# Patient Record
Sex: Female | Born: 1950 | Hispanic: Yes | Marital: Married | Smoking: Former smoker
Health system: Southern US, Community
[De-identification: ages and names within clinical notes are randomized; demographics above are authoritative.]

## PROBLEM LIST (undated history)

## (undated) DIAGNOSIS — Z9221 Personal history of antineoplastic chemotherapy: Secondary | ICD-10-CM

## (undated) DIAGNOSIS — I10 Essential (primary) hypertension: Secondary | ICD-10-CM

## (undated) DIAGNOSIS — K219 Gastro-esophageal reflux disease without esophagitis: Secondary | ICD-10-CM

## (undated) DIAGNOSIS — Z923 Personal history of irradiation: Secondary | ICD-10-CM

## (undated) DIAGNOSIS — E785 Hyperlipidemia, unspecified: Secondary | ICD-10-CM

## (undated) DIAGNOSIS — C50919 Malignant neoplasm of unspecified site of unspecified female breast: Secondary | ICD-10-CM

---

## 1996-12-29 HISTORY — PX: BREAST LUMPECTOMY: SHX2

## 1996-12-29 HISTORY — PX: BREAST BIOPSY: SHX20

## 1997-04-28 DIAGNOSIS — C50919 Malignant neoplasm of unspecified site of unspecified female breast: Secondary | ICD-10-CM

## 1997-04-28 HISTORY — DX: Malignant neoplasm of unspecified site of unspecified female breast: C50.919

## 2011-08-29 ENCOUNTER — Encounter: Payer: Self-pay | Admitting: Internal Medicine

## 2011-08-30 ENCOUNTER — Encounter: Payer: Self-pay | Admitting: Internal Medicine

## 2011-09-24 ENCOUNTER — Ambulatory Visit: Payer: Self-pay | Admitting: Internal Medicine

## 2013-04-29 ENCOUNTER — Encounter: Payer: Self-pay | Admitting: Internal Medicine

## 2017-01-06 ENCOUNTER — Other Ambulatory Visit: Payer: Self-pay | Admitting: Family Medicine

## 2017-01-06 DIAGNOSIS — M8588 Other specified disorders of bone density and structure, other site: Secondary | ICD-10-CM

## 2017-05-21 ENCOUNTER — Other Ambulatory Visit: Payer: Self-pay | Admitting: Family Medicine

## 2017-06-10 ENCOUNTER — Ambulatory Visit
Admission: RE | Admit: 2017-06-10 | Discharge: 2017-06-10 | Disposition: A | Discharge status: Home / Self Care | Payer: Medicare Other | Source: Ambulatory Visit | Attending: Family Medicine | Admitting: Family Medicine

## 2017-06-10 ENCOUNTER — Encounter: Payer: Self-pay | Admitting: Radiology

## 2017-06-10 DIAGNOSIS — Z13820 Encounter for screening for osteoporosis: Secondary | ICD-10-CM | POA: Insufficient documentation

## 2017-06-10 DIAGNOSIS — M8588 Other specified disorders of bone density and structure, other site: Secondary | ICD-10-CM | POA: Insufficient documentation

## 2017-06-10 HISTORY — DX: Malignant neoplasm of unspecified site of unspecified female breast: C50.919

## 2017-06-11 ENCOUNTER — Inpatient Hospital Stay
Admission: RE | Admit: 2017-06-11 | Discharge: 2017-06-11 | Disposition: A | Discharge status: Home / Self Care | Payer: Self-pay | Source: Ambulatory Visit | Attending: *Deleted | Admitting: *Deleted

## 2017-06-11 ENCOUNTER — Other Ambulatory Visit: Payer: Self-pay | Admitting: *Deleted

## 2017-06-11 DIAGNOSIS — Z9289 Personal history of other medical treatment: Secondary | ICD-10-CM

## 2017-06-16 ENCOUNTER — Other Ambulatory Visit: Payer: Self-pay | Admitting: Family Medicine

## 2017-06-16 DIAGNOSIS — Z1231 Encounter for screening mammogram for malignant neoplasm of breast: Secondary | ICD-10-CM

## 2017-06-29 ENCOUNTER — Ambulatory Visit
Admission: RE | Admit: 2017-06-29 | Discharge: 2017-06-29 | Disposition: A | Discharge status: Home / Self Care | Payer: Medicare Other | Source: Ambulatory Visit | Attending: Family Medicine | Admitting: Family Medicine

## 2017-06-29 DIAGNOSIS — Z1231 Encounter for screening mammogram for malignant neoplasm of breast: Secondary | ICD-10-CM | POA: Insufficient documentation

## 2017-06-29 IMAGING — MG MM DIGITAL SCREENING BILAT W/ CAD
4 series · 4 of 4 positions shown · non-contrast
Comparison: Previous exam(s).

CLINICAL DATA: Screening.

EXAM:
DIGITAL SCREENING BILATERAL MAMMOGRAM WITH CAD

[L CC]
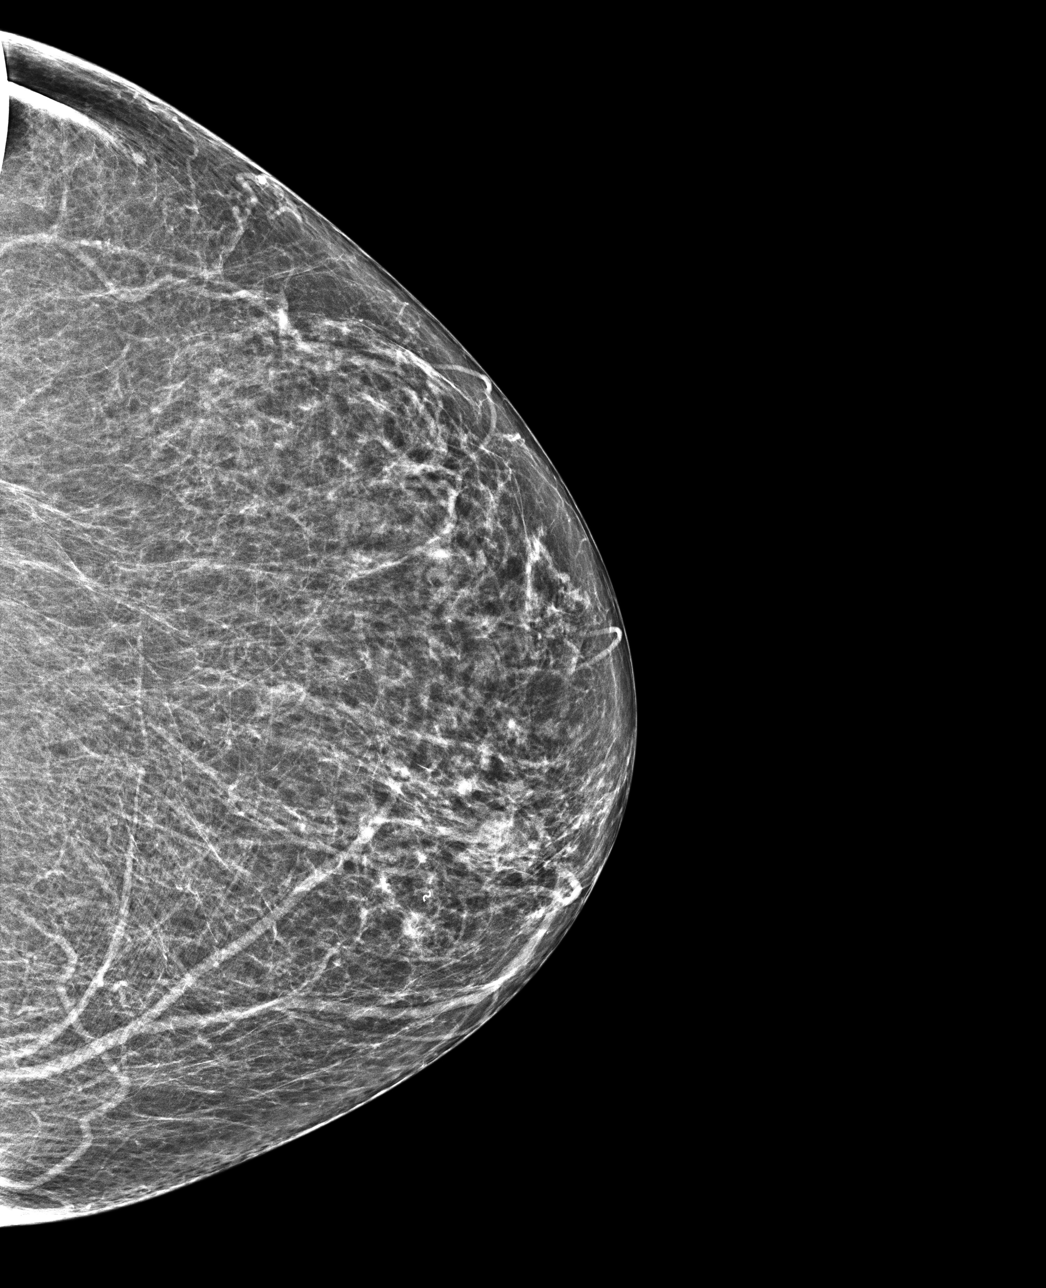

[R CC]
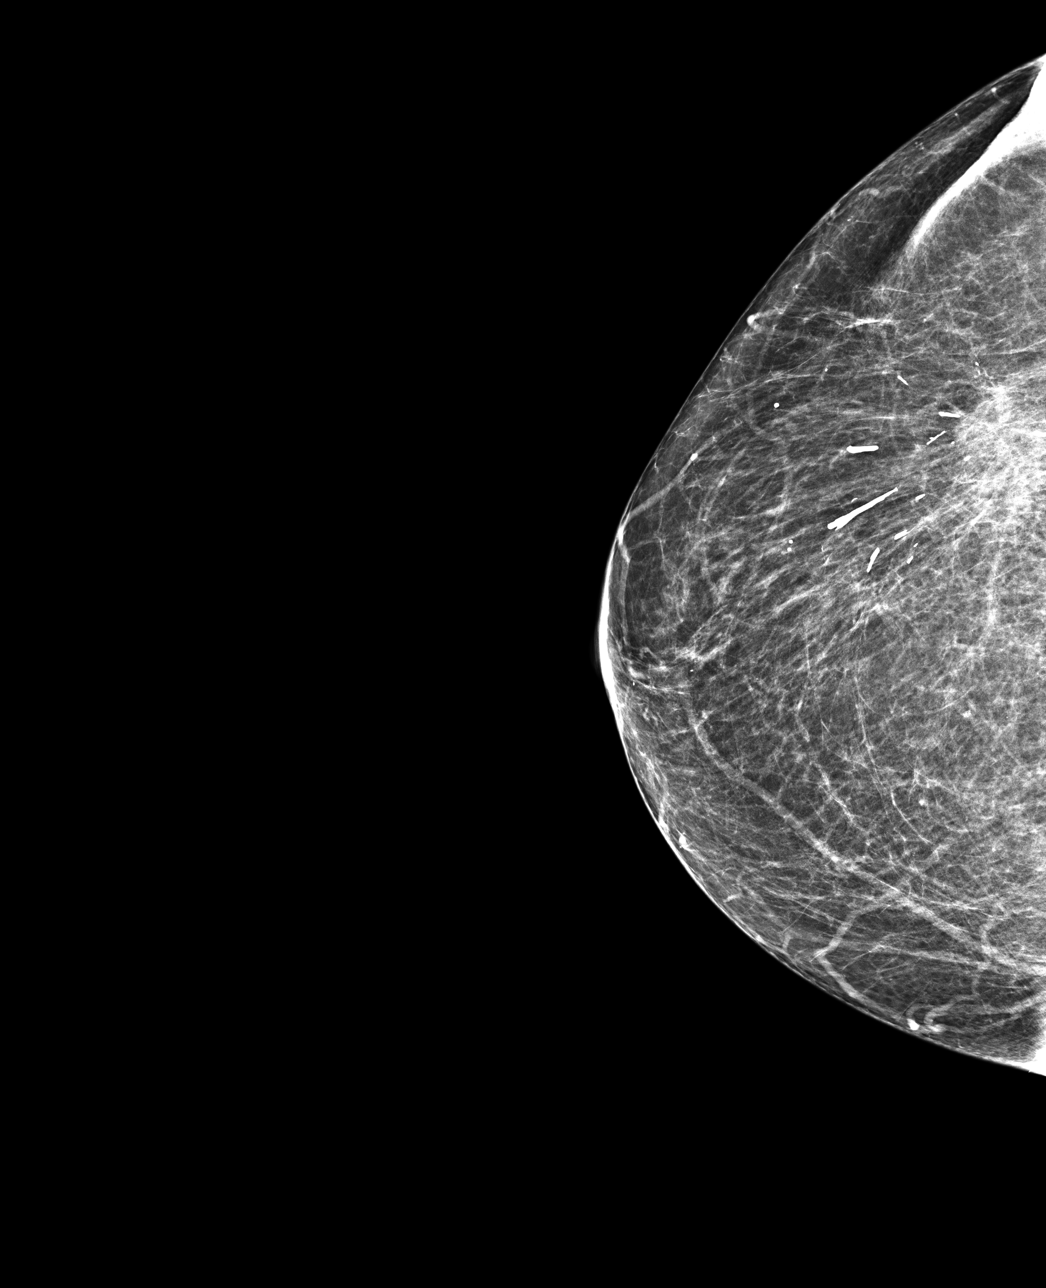

[L MLO]
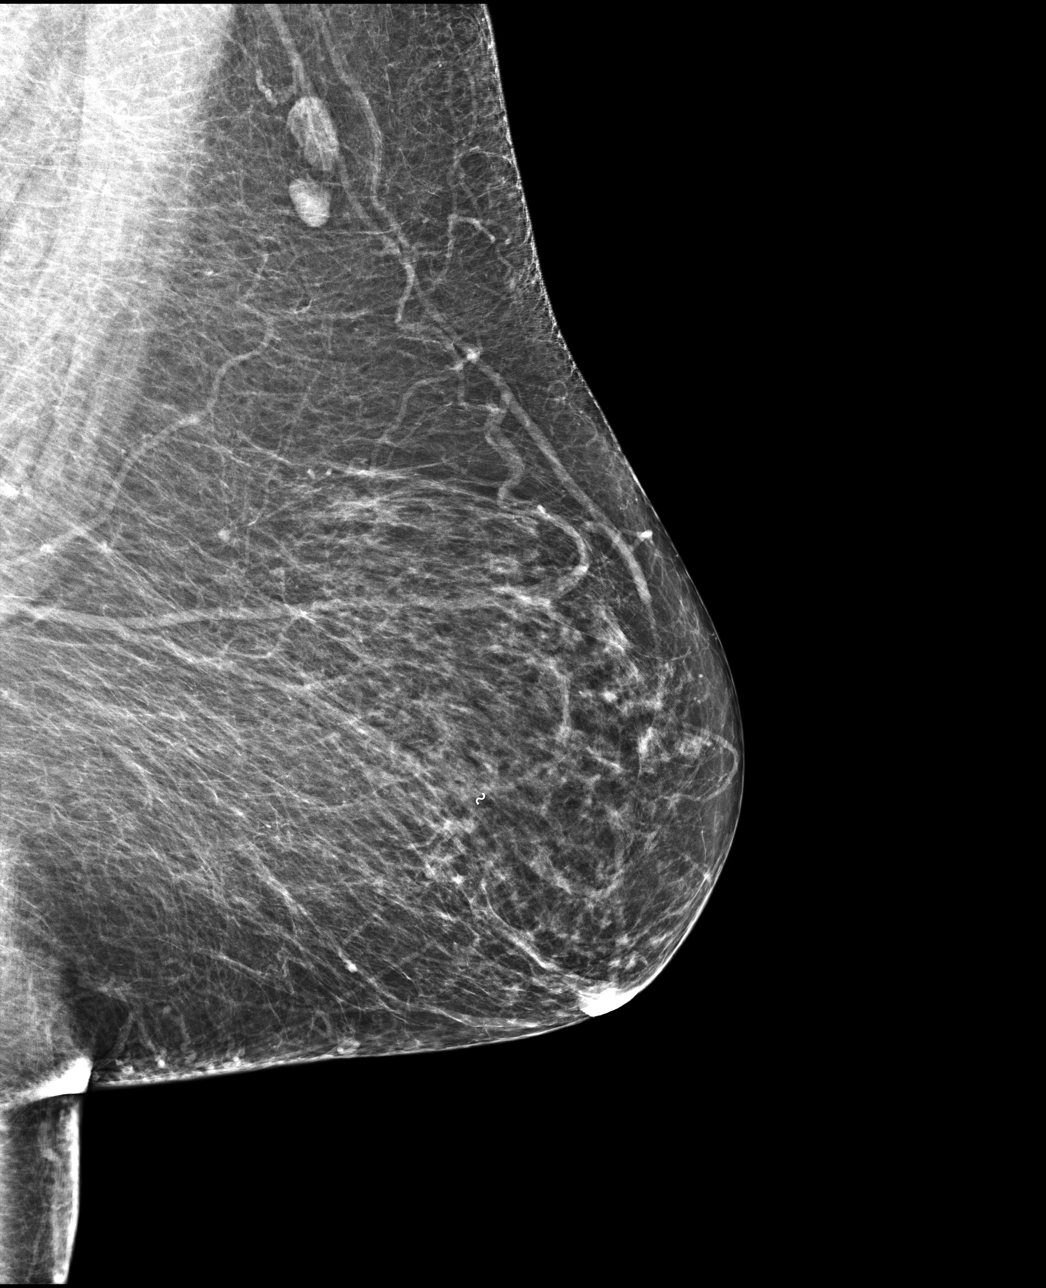

[R MLO]
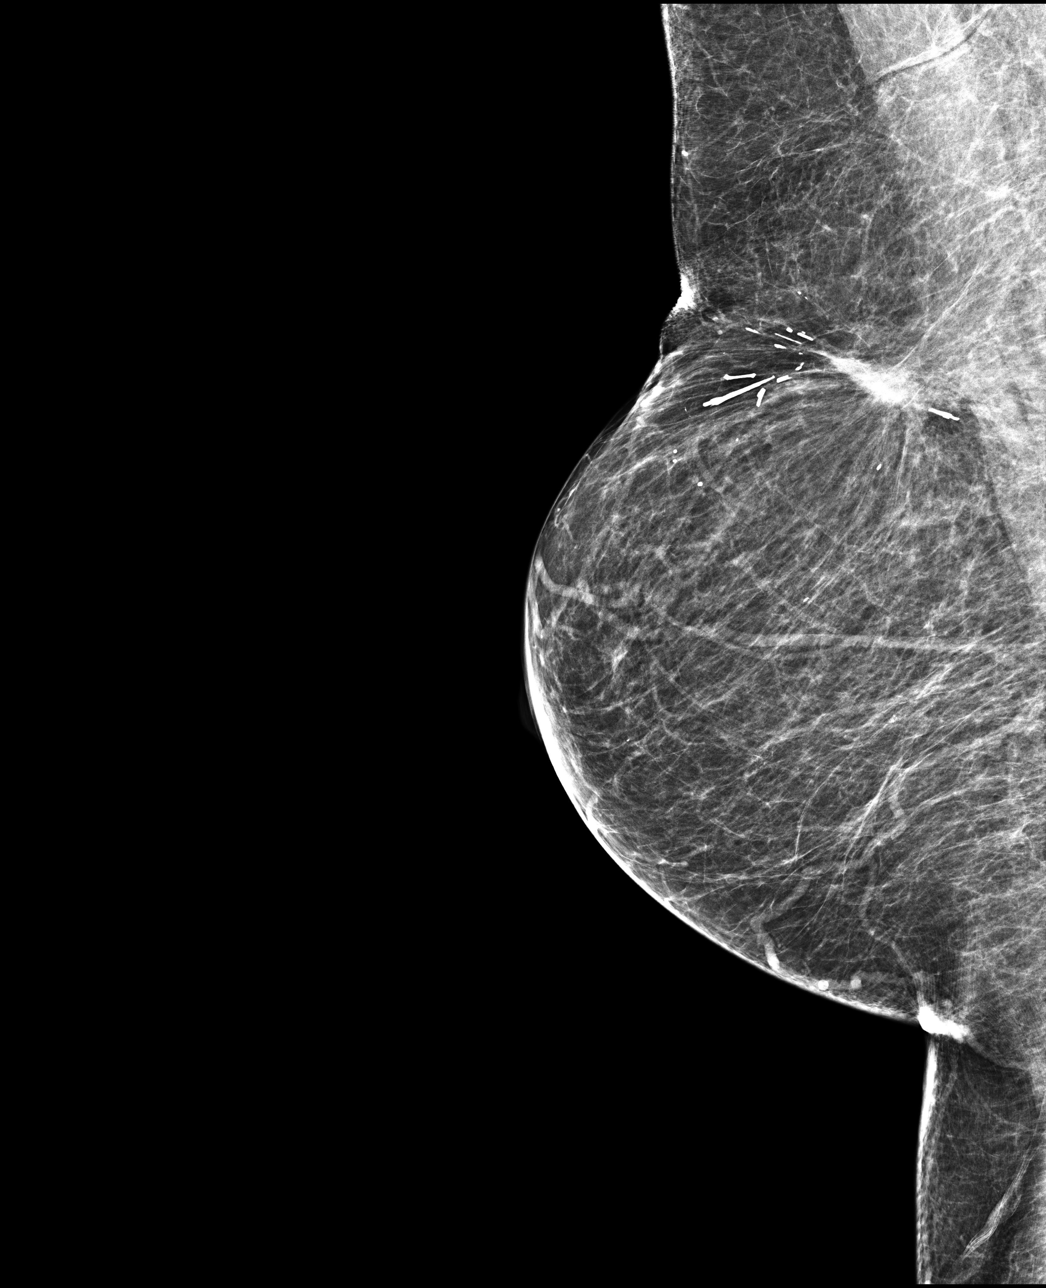

[4 of 4 positions shown; findings below may reference images not displayed]

ACR Breast Density Category b: There are scattered areas of
fibroglandular density.
FINDINGS: There are no findings suspicious for malignancy. Images were
processed with CAD.
IMPRESSION: No mammographic evidence of malignancy. A result letter of this
screening mammogram will be mailed directly to the patient.

RECOMMENDATION:
Screening mammogram in one year. (Code:[US])

BI-RADS CATEGORY  1: Negative.

## 2019-10-05 ENCOUNTER — Other Ambulatory Visit: Payer: Self-pay | Admitting: Family Medicine

## 2019-10-05 DIAGNOSIS — M8580 Other specified disorders of bone density and structure, unspecified site: Secondary | ICD-10-CM

## 2019-10-05 DIAGNOSIS — Z780 Asymptomatic menopausal state: Secondary | ICD-10-CM

## 2020-03-23 ENCOUNTER — Other Ambulatory Visit: Payer: Self-pay | Admitting: Physician Assistant

## 2020-03-23 DIAGNOSIS — Z1231 Encounter for screening mammogram for malignant neoplasm of breast: Secondary | ICD-10-CM

## 2020-03-26 ENCOUNTER — Other Ambulatory Visit: Payer: Self-pay | Admitting: Physician Assistant

## 2020-03-26 DIAGNOSIS — Z780 Asymptomatic menopausal state: Secondary | ICD-10-CM

## 2020-03-27 ENCOUNTER — Inpatient Hospital Stay: Admission: RE | Admit: 2020-03-27 | Payer: Medicare Other | Source: Ambulatory Visit

## 2020-03-27 ENCOUNTER — Other Ambulatory Visit: Payer: Self-pay

## 2020-03-27 ENCOUNTER — Encounter (INDEPENDENT_AMBULATORY_CARE_PROVIDER_SITE_OTHER): Payer: Self-pay

## 2020-03-27 ENCOUNTER — Ambulatory Visit
Admission: RE | Admit: 2020-03-27 | Discharge: 2020-03-27 | Disposition: A | Discharge status: Home / Self Care | Payer: Medicare Other | Source: Ambulatory Visit | Attending: Physician Assistant | Admitting: Physician Assistant

## 2020-03-27 DIAGNOSIS — Z1231 Encounter for screening mammogram for malignant neoplasm of breast: Secondary | ICD-10-CM | POA: Diagnosis present

## 2020-03-27 HISTORY — DX: Personal history of antineoplastic chemotherapy: Z92.21

## 2020-03-27 HISTORY — DX: Personal history of irradiation: Z92.3

## 2020-03-27 IMAGING — MG DIGITAL SCREENING BILAT W/ TOMO W/ CAD
6 of 12 series · 6 of 36 positions shown · non-contrast
Comparison: Previous exam(s).

CLINICAL DATA: Screening. History of RIGHT breast cancer in [O5]
status post lumpectomy and radiation therapy.

EXAM:
DIGITAL SCREENING BILATERAL MAMMOGRAM WITH TOMO AND CAD

[L MLO synth-2D]
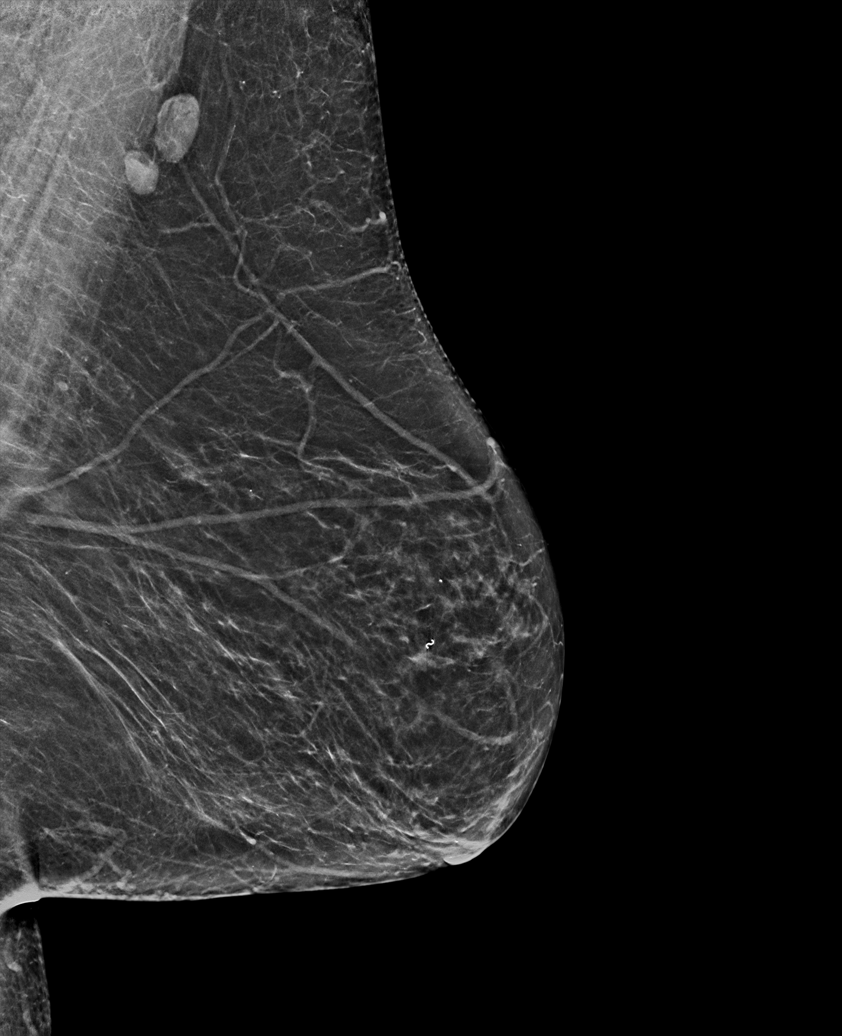

[L CC synth-2D (1 of 2)]
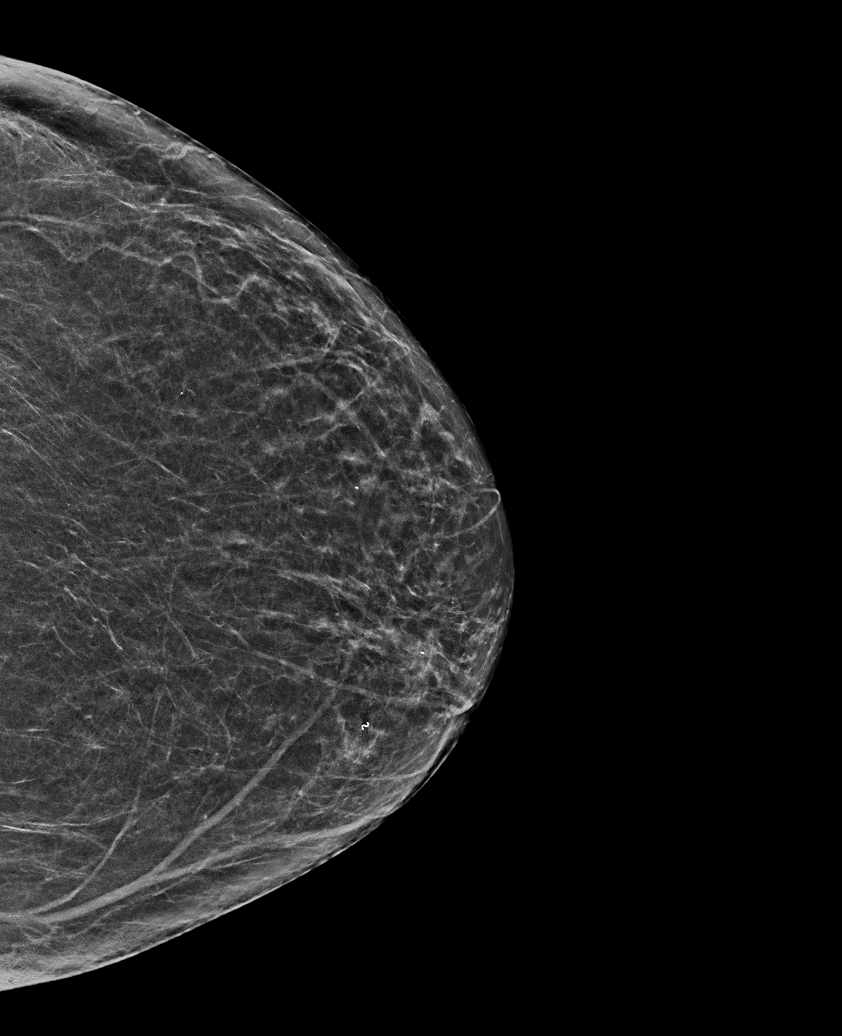

[R MLO synth-2D]
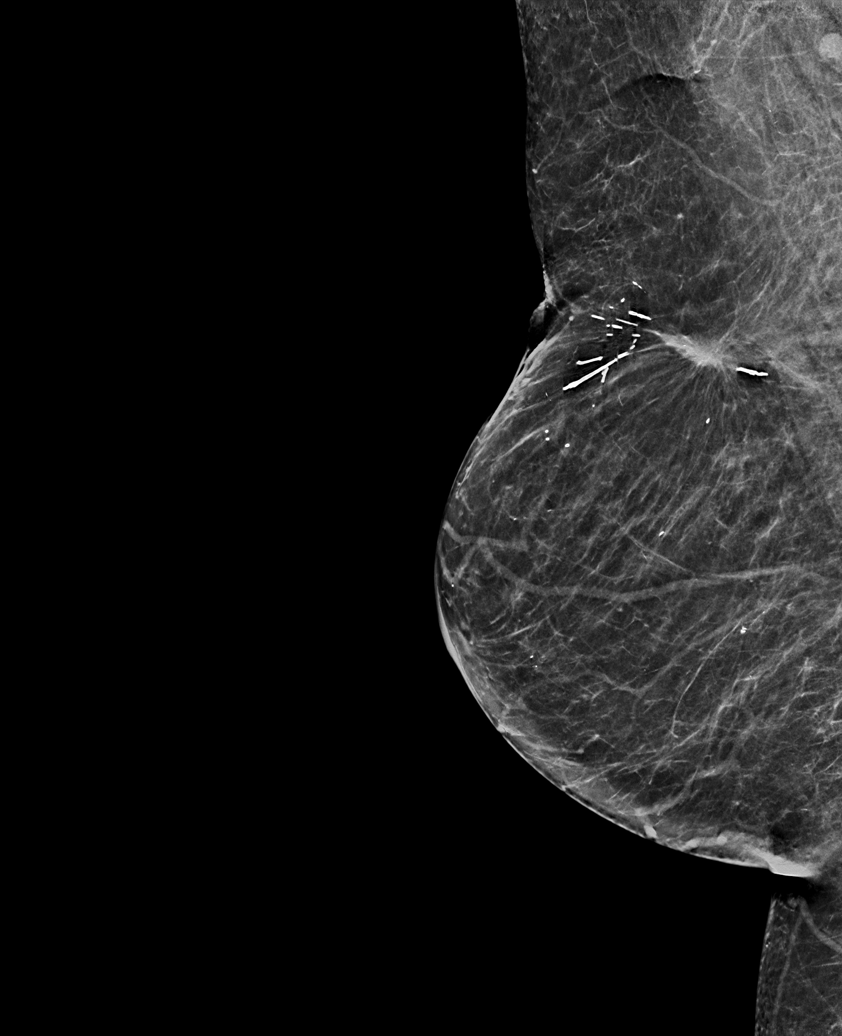

[R CC synth-2D]
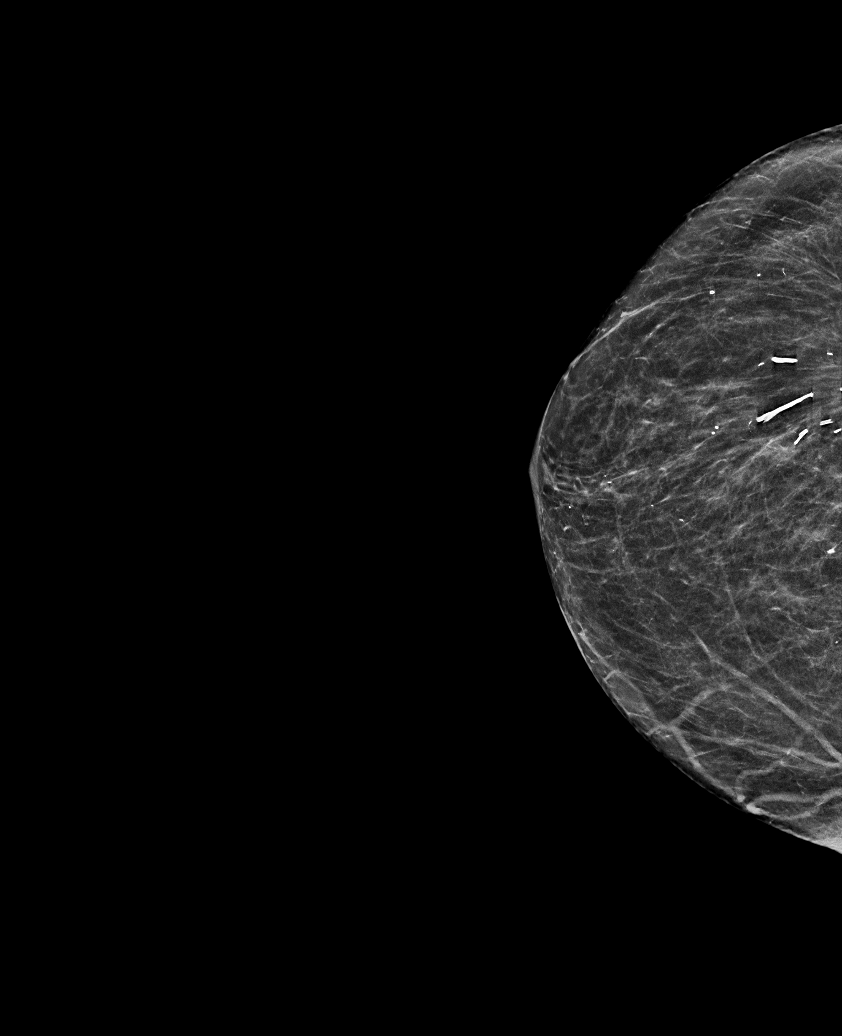

[L CC synth-2D (2 of 2)]
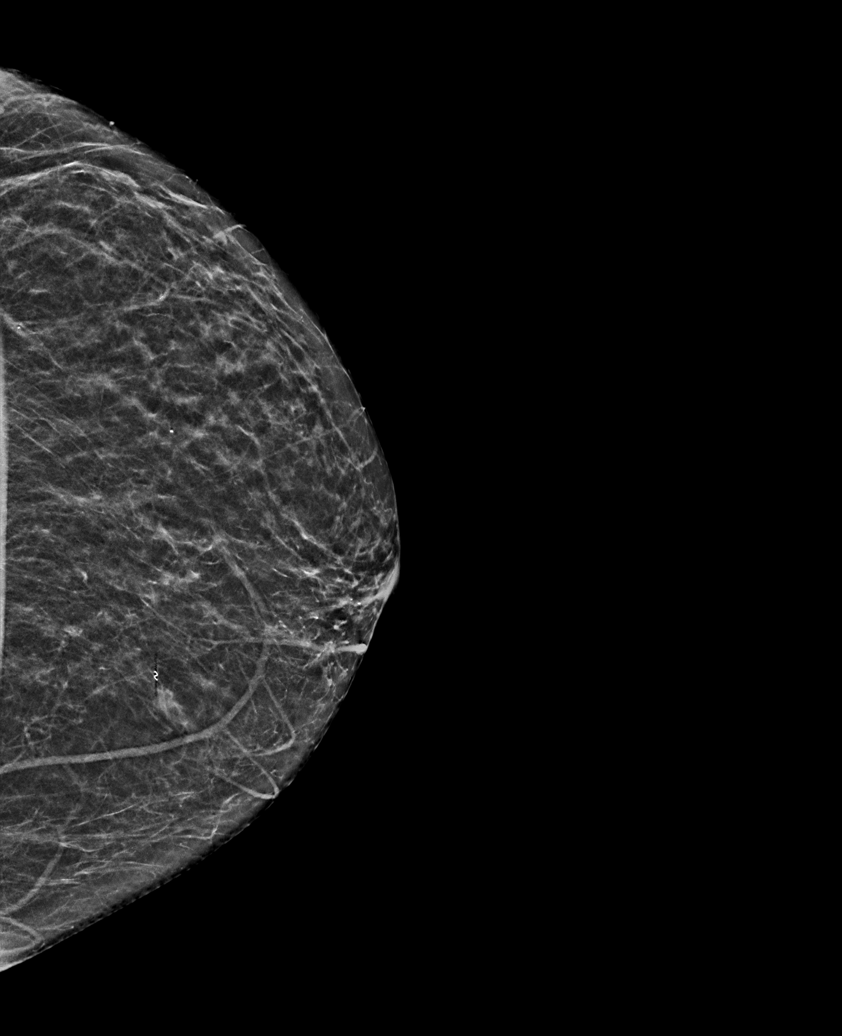

[R XCCL synth-2D]
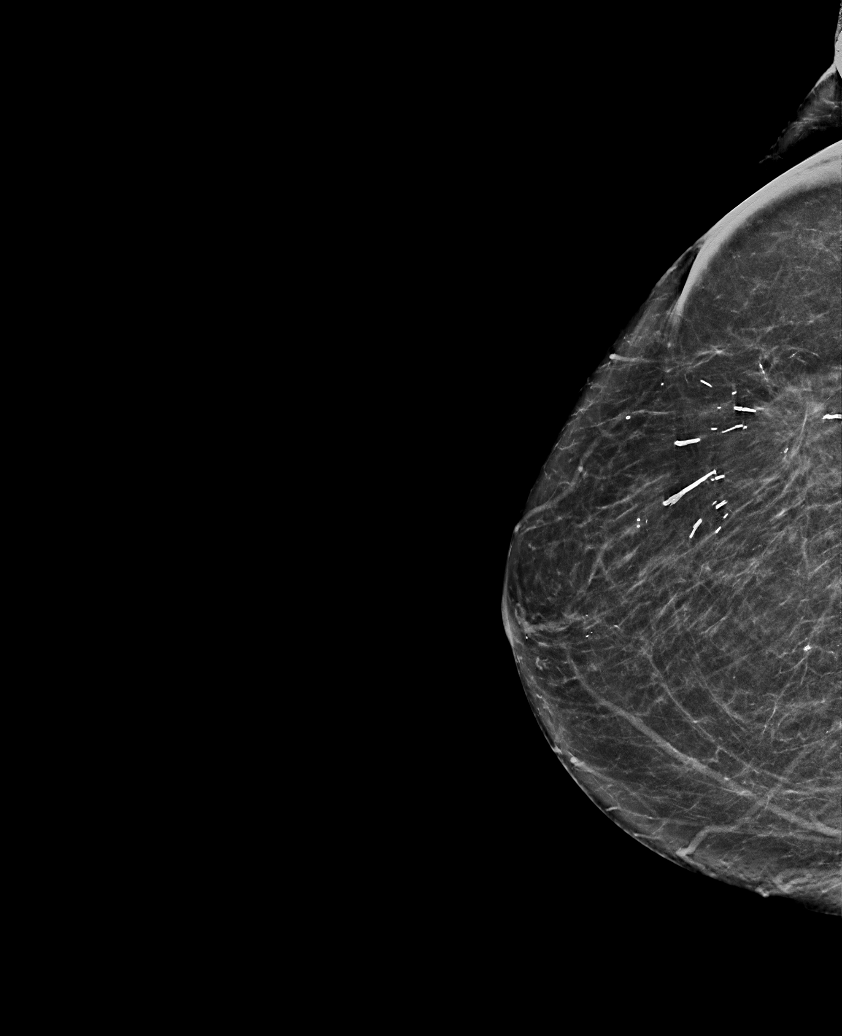

[6 of 36 positions shown; findings below may reference images not displayed]

ACR Breast Density Category b: There are scattered areas of
fibroglandular density.
FINDINGS: Stable postsurgical changes within the RIGHT breast. There are no
findings suspicious for malignancy within either breast. Images were
processed with CAD.
IMPRESSION: No mammographic evidence of malignancy. A result letter of this
screening mammogram will be mailed directly to the patient.

RECOMMENDATION:
Screening mammogram in one year. (Code:[O5])

BI-RADS CATEGORY  2: Benign.

## 2020-10-23 ENCOUNTER — Ambulatory Visit
Admission: RE | Admit: 2020-10-23 | Discharge: 2020-10-23 | Disposition: A | Discharge status: Home / Self Care | Payer: Medicare Other | Source: Ambulatory Visit | Attending: Physician Assistant | Admitting: Physician Assistant

## 2020-10-23 ENCOUNTER — Other Ambulatory Visit: Payer: Self-pay

## 2020-10-23 DIAGNOSIS — Z780 Asymptomatic menopausal state: Secondary | ICD-10-CM | POA: Insufficient documentation

## 2020-12-18 ENCOUNTER — Encounter: Payer: Self-pay | Admitting: Emergency Medicine

## 2020-12-18 ENCOUNTER — Ambulatory Visit (INDEPENDENT_AMBULATORY_CARE_PROVIDER_SITE_OTHER): Payer: Medicare Other

## 2020-12-18 ENCOUNTER — Ambulatory Visit
Admission: EM | Admit: 2020-12-18 | Discharge: 2020-12-18 | Disposition: A | Discharge status: Home / Self Care | Payer: Medicare Other | Attending: Sports Medicine | Admitting: Sports Medicine

## 2020-12-18 ENCOUNTER — Other Ambulatory Visit: Payer: Self-pay

## 2020-12-18 DIAGNOSIS — M5416 Radiculopathy, lumbar region: Secondary | ICD-10-CM

## 2020-12-18 DIAGNOSIS — M5136 Other intervertebral disc degeneration, lumbar region: Secondary | ICD-10-CM

## 2020-12-18 DIAGNOSIS — M79604 Pain in right leg: Secondary | ICD-10-CM

## 2020-12-18 HISTORY — DX: Essential (primary) hypertension: I10

## 2020-12-18 IMAGING — CR DG LUMBAR SPINE COMPLETE 4+V
5 series · 6 of 6 positions shown · non-contrast
Comparison: None.

CLINICAL DATA: Right leg pain.

EXAM:
LUMBAR SPINE - COMPLETE 4+ VIEW

[l-spine ap]
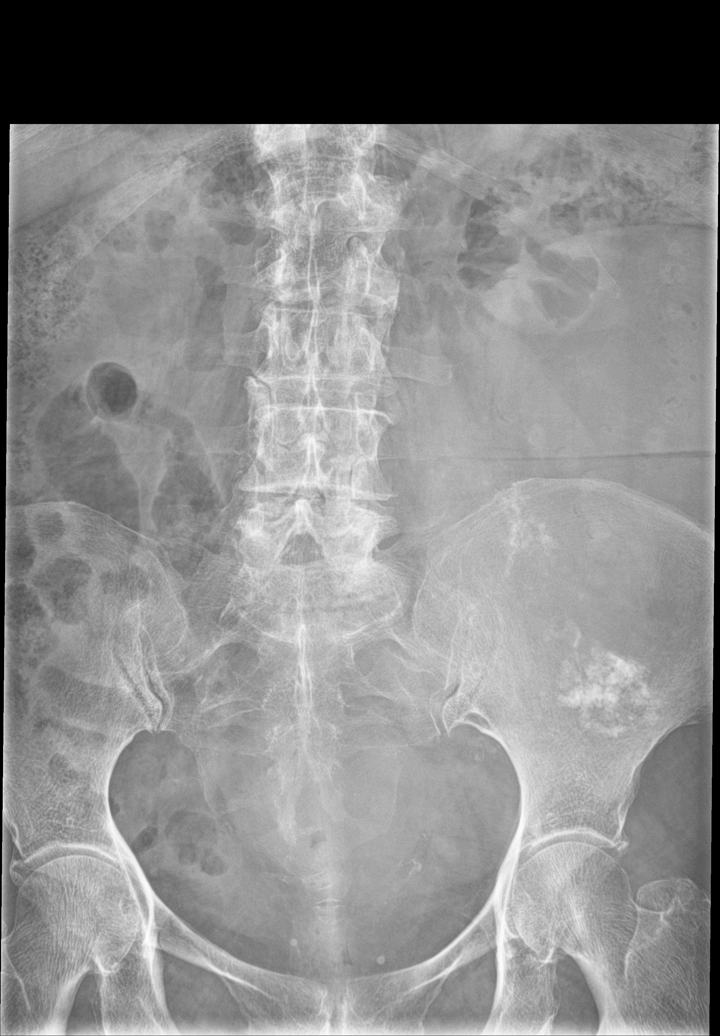

[Series 2: l-spine obl · 0.14mm/px · 2 of 2 slices shown (1 of 2)]
[im 1/2]
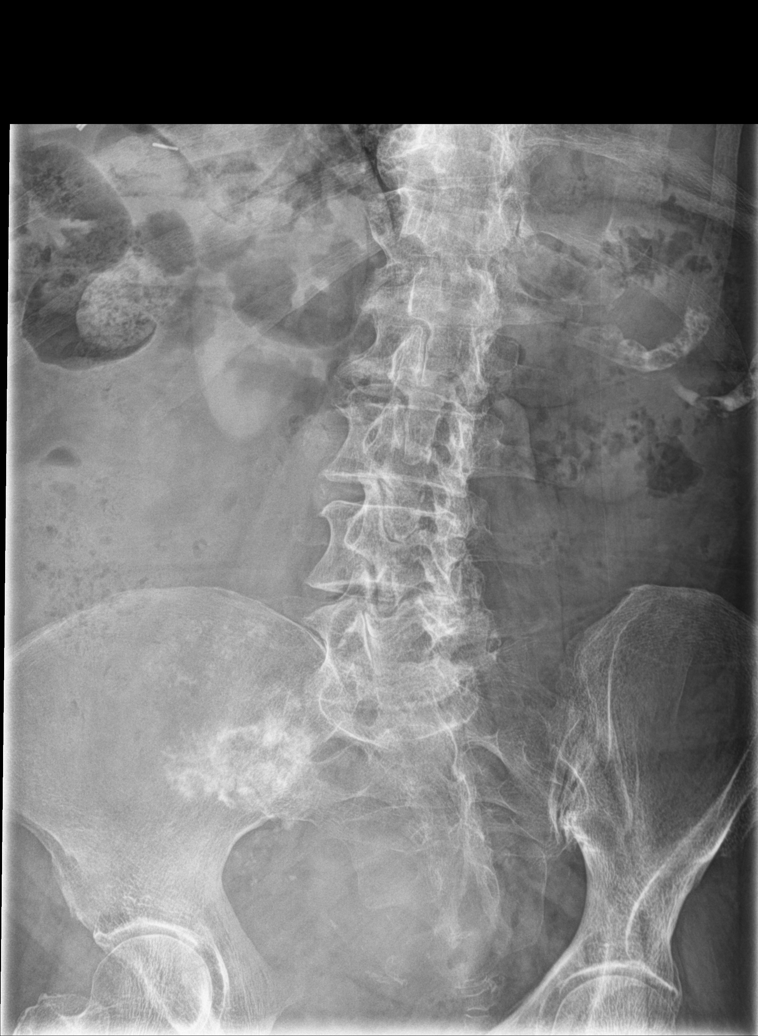
[im 2/2]
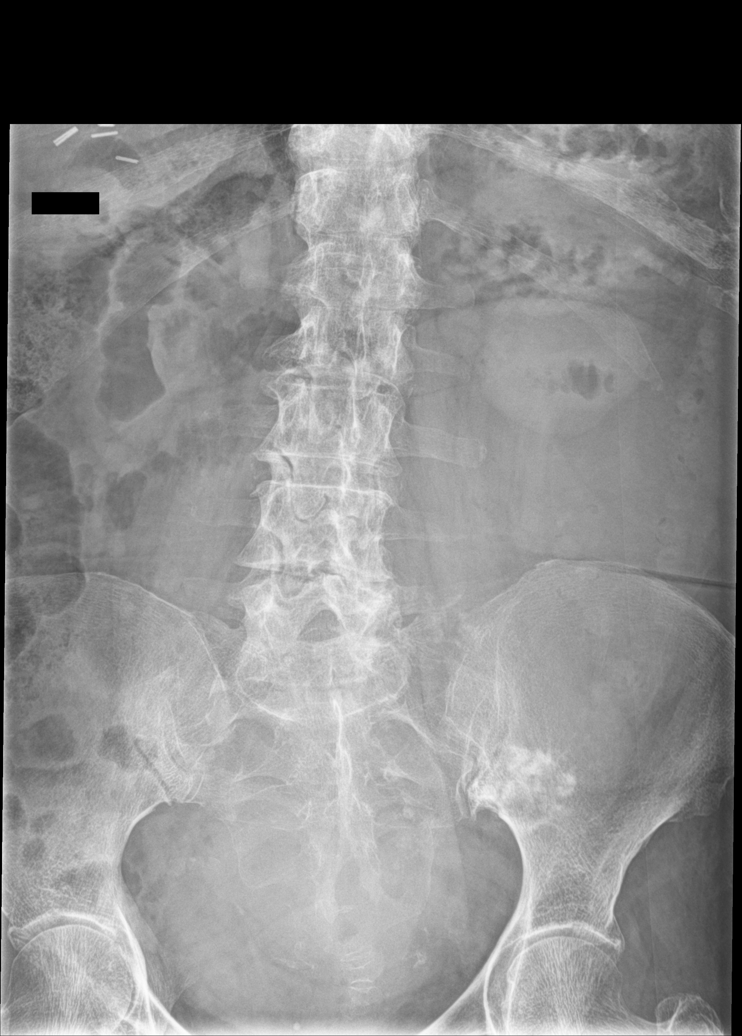

[l-spine obl (2 of 2)]
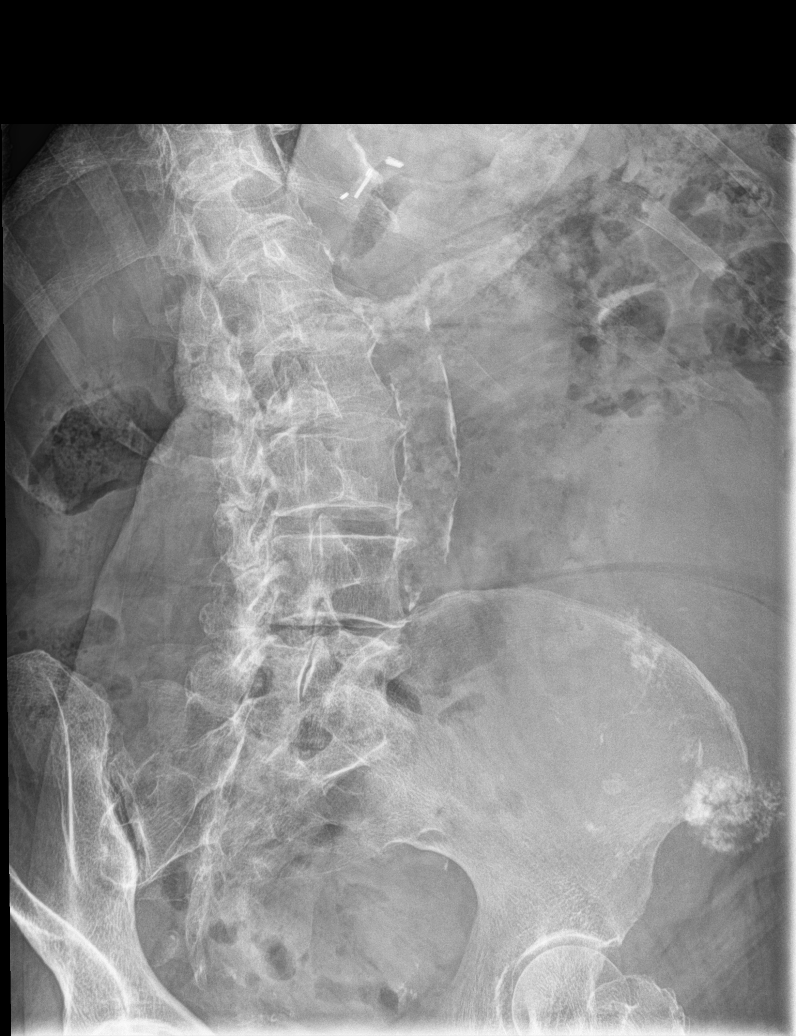

[l-spine lat]
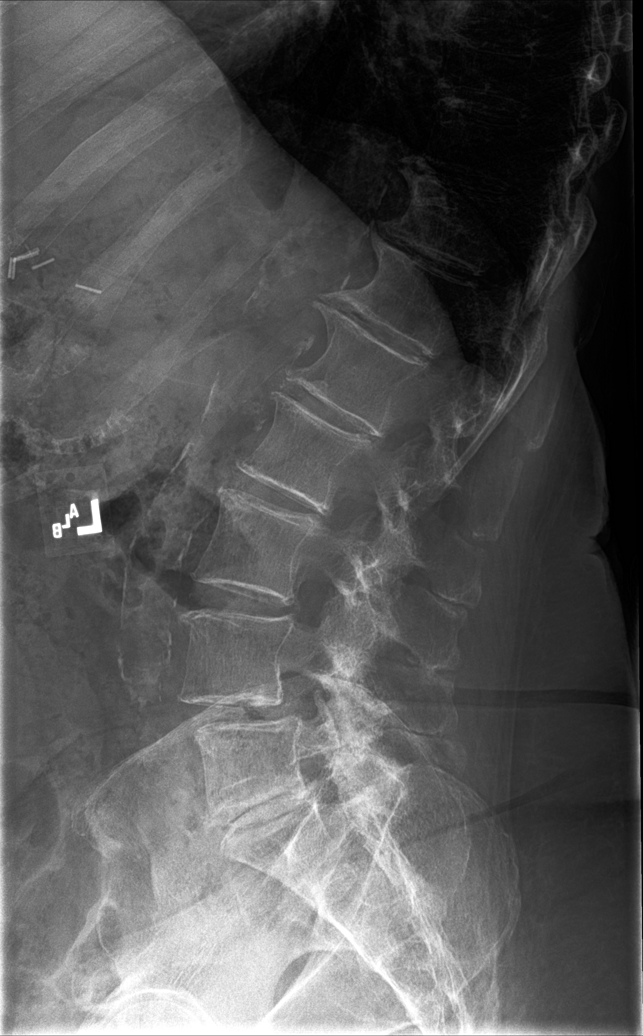

[l-spine spot]
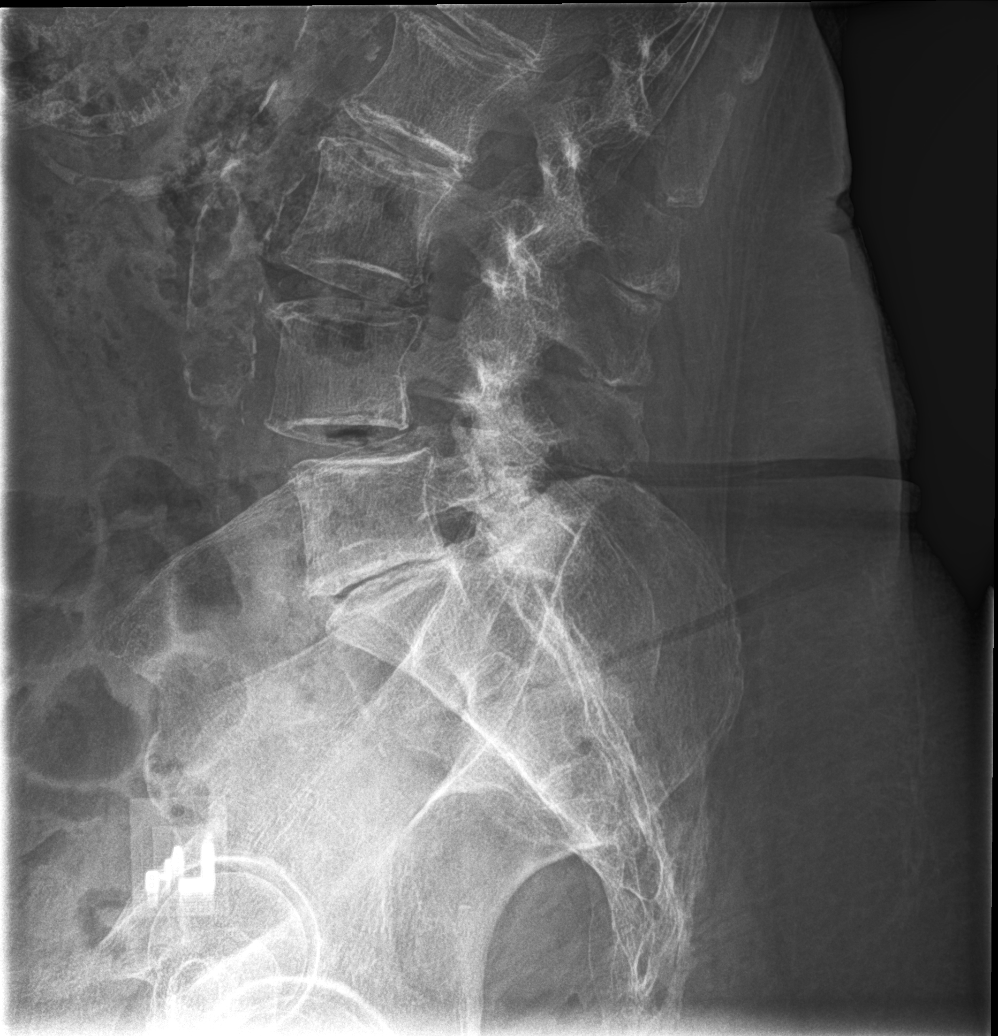

[6 of 6 positions shown; findings below may reference images not displayed]

FINDINGS: No evidence acute fracture. Vertebral body heights are maintained.
Grade 1 anterolisthesis of L4 on L5, favor degenerative given lower
lumbar facet arthropathy. Severe degenerative disc height loss at
L5-S1 with disc height loss, endplate sclerosis, vacuum disc
phenomenon, and endplate spurring. Mild degenerative height loss at
L4-L5. Osteopenia. Aortic calcific atherosclerosis. Cholecystectomy
clips.
IMPRESSION: 1. No evidence of acute fracture.
2. Grade 1 anterolisthesis of L4 on L5, favor degenerative given
lower lumbar facet arthropathy.
3. Severe degenerative disc disease at L5-S1.

## 2020-12-18 MED ORDER — PREDNISONE 10 MG (21) PO TBPK: ORAL_TABLET | Freq: Every day | ORAL | 0 refills | Status: DC

## 2020-12-18 NOTE — Discharge Instructions (Addendum)
Went over the x-ray in findings in detail. We will go ahead and treat her with a Sterapred Dosepak. We will refer her to orthopedics.  I will have her call for an appointment.  I do feel as though she may be a good candidate if the steroids do not work for an epidural steroid injection plus or minus physical therapy. Warned her not to take any over-the-counter anti-inflammatories including, Motrin, Advil, ibuprofen, Naprosyn, or Aleve.  She can take Tylenol. Supportive care, activity as tolerated and follow-up here as needed.

## 2020-12-18 NOTE — ED Provider Notes (Signed)
[facility]    CSN: [id number] Arrival date & time: 12/18/20  1102      History   Chief Complaint Chief Complaint  Patient presents with  . Leg Pain    right    HPI Pamela Trevino is a 69 y.o. female.   Pleasant 69 year old female who presents for evaluation of right anterior thigh pain.  On further history it appears as though she was breaking down some boxes about a week ago, and although she does not recall a specific injury the next day she noted that her right anterior thigh was hurting.  She thought she pulled a muscle.  She managed it with supportive care including topical ointments and activity modification.  Yesterday she started getting right buttock pain and its wrapping around to the anterior aspect of her thigh.  There is no involvement of the hamstring.  Her pain does not go past her knee.  She denies any foot drop.  She says it is a burning and it keeps her awake at night.  She called her primary care physician's office and they had some concern about a blood clot and sent her over here for evaluation and management.  Patient denies any shortness of breath, chest pain, recent travel, smoking, or supplemental estrogen use.  She does have a history of breast cancer and has had chemo and radiation.  This is currently in remission and she is not currently on any medication or being followed by anybody for this.     Past Medical History:  Diagnosis Date  . Breast cancer ([place]) 04/1997   right breast cancer, chemo and radiation  . Hypertension   . Personal history of chemotherapy   . Personal history of radiation therapy     There are no problems to display for this patient.   Past Surgical History:  Procedure Laterality Date  . BREAST BIOPSY Left 1998   negative  . BREAST BIOPSY Right 1998   positive  . BREAST LUMPECTOMY Right 1998   lumpectomy with chemo and rad tx    OB History   No obstetric history on file.      Home Medications    Prior  to Admission medications   Medication Sig Start Date End Date Taking? Authorizing Provider  aspirin 81 MG EC tablet Take 1 tablet by mouth daily. 11/12/20  Yes [provider]  atorvastatin (LIPITOR) 20 MG tablet Take by mouth. 12/07/20  Yes [provider]  diclofenac (VOLTAREN) 75 MG EC tablet Take by mouth. 09/24/20 09/24/21 Yes [provider]  losartan-hydrochlorothiazide (HYZAAR) 100-12.5 MG tablet Take 1 tablet by mouth daily. 06/01/20 06/01/21 Yes [provider]  omeprazole (PRILOSEC) 40 MG capsule Take by mouth. 11/26/20  Yes [provider]  Vitamin D, Ergocalciferol, (DRISDOL) 1.25 MG (50000 UNIT) CAPS capsule Take 50,000 Units by mouth once a week. 12/03/20  Yes [provider]  predniSONE (STERAPRED UNI-PAK 21 TAB) 10 MG (21) TBPK tablet Take by mouth daily. Take 6 tabs by mouth daily  for 2 days, then 5 tabs for 2 days, then 4 tabs for 2 days, then 3 tabs for 2 days, 2 tabs for 2 days, then 1 tab by mouth daily for 2 days 12/18/20   Verda Cumins, MD    Family History Family History  Problem Relation Age of Onset  . Breast cancer Neg Hx     Social History Social History   Tobacco Use  . Smoking status: Never Smoker  .  Smokeless tobacco: Never Used  Vaping Use  . Vaping Use: Never used  Substance Use Topics  . Alcohol use: Yes  . Drug use: Not Currently     Allergies   Patient has no known allergies.   Review of Systems Review of Systems  Constitutional: Negative for appetite change, chills and fatigue.  HENT: Negative for congestion and sore throat.   Respiratory: Negative for cough, choking, chest tightness and shortness of breath.   Cardiovascular: Negative for chest pain.  Genitourinary: Negative for dysuria.  Skin: Negative for color change, pallor, rash and wound.  Neurological: Negative for dizziness.  All other systems reviewed and are negative.    Physical Exam Triage Vital Signs ED Triage Vitals   Enc Vitals Group     BP 12/18/20 1241 (!) 187/84     Pulse Rate 12/18/20 1241 73     Resp 12/18/20 1241 18     Temp 12/18/20 1241 98.1 F (36.7 C)     Temp Source 12/18/20 1241 Oral     SpO2 12/18/20 1241 99 %     Weight 12/18/20 1238 200 lb (90.7 kg)     Height 12/18/20 1238 5\' 5"  (1.651 m)     Head Circumference --      Peak Flow --      Pain Score 12/18/20 1238 10     Pain Loc --      Pain Edu? --      Excl. in [place]? --    No data found.  Updated Vital Signs BP (!) 187/84 (BP Location: Right Arm)   Pulse 73   Temp 98.1 F (36.7 C) (Oral)   Resp 18   Ht 5\' 5"  (1.651 m)   Wt 90.7 kg   SpO2 99%   BMI 33.28 kg/m   Visual Acuity Right Eye Distance:   Left Eye Distance:   Bilateral Distance:    Right Eye Near:   Left Eye Near:    Bilateral Near:     Physical Exam  General: Alert, pleasant, no acute distress. Musculoskeletal exam: There is no tenderness to palpation over all the vertebrae and facets.  No bony abnormality ecchymosis erythema soft tissue swelling.  She is tender to palpation into the right buttock and over the piriformis and the gluteus medius muscle group.  She actually has good range of motion with forward flexion to her toes.  This does not accentuate any of her symptoms.  She can get 10 degrees of extension which causes some pain into the right buttock and down the anterior aspect of the upper leg.  Lateral side bends or rotation are within normal limits without any pain.  Her strength is well-preserved in all planes.  She has no focal strength deficits.  She has 2+ DTRs.  She is tender to palpation over the anterior aspect of the thigh.  There is no tenderness or swelling in the calf.  Homans and Carman test are negative. Neurovascular: Normal sensation 2+ pulses distally. UC Treatments / Results  Labs (all labs ordered are listed, but only abnormal results are displayed) Labs Reviewed - No data to display  EKG   Radiology DG Lumbar Spine  Complete  Result Date: 12/18/2020 CLINICAL DATA:  Right leg pain. EXAM: LUMBAR SPINE - COMPLETE 4+ VIEW COMPARISON:  None. FINDINGS: No evidence acute fracture. Vertebral body heights are maintained. Grade 1 anterolisthesis of L4 on L5, favor degenerative given lower lumbar facet arthropathy. Severe degenerative disc height loss at L5-S1 with  disc height loss, endplate sclerosis, vacuum disc phenomenon, and endplate spurring. Mild degenerative height loss at L4-L5. Osteopenia. Aortic calcific atherosclerosis. Cholecystectomy clips. IMPRESSION: 1. No evidence of acute fracture. 2. Grade 1 anterolisthesis of L4 on L5, favor degenerative given lower lumbar facet arthropathy. 3. Severe degenerative disc disease at L5-S1. Electronically Signed   By: Margaretha Sheffield MD   On: 12/18/2020 13:53    Procedures Procedures (including critical care time)  Medications Ordered in UC Medications - No data to display  Initial Impression / Assessment and Plan / UC Course  I have reviewed the triage vital signs and the nursing notes.  Pertinent labs & imaging results that were available during my care of the patient were reviewed by me and considered in my medical decision making (see chart for details).  Clinical impression: 69 year old female with 1 week of right leg pain.  X-rays as above.  She has some severe degenerative changes.  I suspect this is coming from her back radiating down her leg.  Cannot fully rule out a DVT.  I advised her of the red flag signs and symptoms.  We will give her some literature on that.  Treatment plan: 1.  The findings and treatment plan discussed in detail with patient.  Patient was agreed. 2.  Went over the x-ray in findings in detail. 3.  We will go ahead and treat her with a Sterapred Dosepak. 4.  We will refer her to orthopedics.  I will have her call for an appointment.  I do feel as though she may be a good candidate if the steroids do not work for an epidural steroid  injection plus or minus physical therapy. 5.  Warned her not to take any over-the-counter anti-inflammatories including, Motrin, Advil, ibuprofen, Naprosyn, or Aleve.  She can take Tylenol. 6.  Supportive care, activity as tolerated and follow-up here as needed.     Final Clinical Impressions(s) / UC Diagnoses   Final diagnoses:  Right leg pain  DDD (degenerative disc disease), lumbar  Lumbar radicular pain     Discharge Instructions     Went over the x-ray in findings in detail. We will go ahead and treat her with a Sterapred Dosepak. We will refer her to orthopedics.  I will have her call for an appointment.  I do feel as though she may be a good candidate if the steroids do not work for an epidural steroid injection plus or minus physical therapy. Warned her not to take any over-the-counter anti-inflammatories including, Motrin, Advil, ibuprofen, Naprosyn, or Aleve.  She can take Tylenol. Supportive care, activity as tolerated and follow-up here as needed.    ED Prescriptions    Medication Sig Dispense Auth. Provider   predniSONE (STERAPRED UNI-PAK 21 TAB) 10 MG (21) TBPK tablet Take by mouth daily. Take 6 tabs by mouth daily  for 2 days, then 5 tabs for 2 days, then 4 tabs for 2 days, then 3 tabs for 2 days, 2 tabs for 2 days, then 1 tab by mouth daily for 2 days 42 tablet Verda Cumins, MD     PDMP not reviewed this encounter.   Verda Cumins, MD 12/18/20 [phone]

## 2020-12-18 NOTE — ED Triage Notes (Signed)
Pt c/o right leg pain that radiates around her thigh. Started about a week ago. She is also having right sided lower back pain.

## 2021-02-26 ENCOUNTER — Other Ambulatory Visit: Payer: Self-pay

## 2021-02-26 ENCOUNTER — Encounter: Payer: Self-pay | Admitting: Emergency Medicine

## 2021-02-26 ENCOUNTER — Emergency Department: Payer: Medicare Other

## 2021-02-26 ENCOUNTER — Emergency Department
Admission: EM | Admit: 2021-02-26 | Discharge: 2021-02-26 | Disposition: A | Discharge status: Other Acute Inpatient Hospital | Payer: Medicare Other | Attending: Emergency Medicine | Admitting: Emergency Medicine

## 2021-02-26 DIAGNOSIS — A419 Sepsis, unspecified organism: Secondary | ICD-10-CM | POA: Diagnosis not present

## 2021-02-26 DIAGNOSIS — R1900 Intra-abdominal and pelvic swelling, mass and lump, unspecified site: Secondary | ICD-10-CM

## 2021-02-26 DIAGNOSIS — R1907 Generalized intra-abdominal and pelvic swelling, mass and lump: Secondary | ICD-10-CM | POA: Diagnosis not present

## 2021-02-26 DIAGNOSIS — R112 Nausea with vomiting, unspecified: Secondary | ICD-10-CM | POA: Insufficient documentation

## 2021-02-26 DIAGNOSIS — R509 Fever, unspecified: Secondary | ICD-10-CM | POA: Diagnosis present

## 2021-02-26 DIAGNOSIS — Z853 Personal history of malignant neoplasm of breast: Secondary | ICD-10-CM | POA: Diagnosis not present

## 2021-02-26 DIAGNOSIS — I10 Essential (primary) hypertension: Secondary | ICD-10-CM | POA: Diagnosis not present

## 2021-02-26 DIAGNOSIS — R000 Tachycardia, unspecified: Secondary | ICD-10-CM | POA: Insufficient documentation

## 2021-02-26 DIAGNOSIS — Z7982 Long term (current) use of aspirin: Secondary | ICD-10-CM | POA: Diagnosis not present

## 2021-02-26 DIAGNOSIS — Z79899 Other long term (current) drug therapy: Secondary | ICD-10-CM | POA: Insufficient documentation

## 2021-02-26 DIAGNOSIS — Z20822 Contact with and (suspected) exposure to covid-19: Secondary | ICD-10-CM | POA: Insufficient documentation

## 2021-02-26 LAB — URINALYSIS, COMPLETE (UACMP) WITH MICROSCOPIC
Bacteria, UA: NONE SEEN
Bilirubin Urine: NEGATIVE
Glucose, UA: NEGATIVE mg/dL
Ketones, ur: 5 mg/dL — AB
Leukocytes,Ua: NEGATIVE
Nitrite: NEGATIVE
Protein, ur: 100 mg/dL — AB
Specific Gravity, Urine: >1.046 — ABNORMAL HIGH (ref 1.005–1.030)
pH: 5 (ref 5.0–8.0)

## 2021-02-26 LAB — CBC
HCT: 39.5 % (ref 36.0–46.0)
Hemoglobin: 12.8 g/dL (ref 12.0–15.0)
MCH: 29.4 pg (ref 26.0–34.0)
MCHC: 32.4 g/dL (ref 30.0–36.0)
MCV: 90.6 fL (ref 80.0–100.0)
Platelets: 223 10*3/uL (ref 150–400)
RBC: 4.36 MIL/uL (ref 3.87–5.11)
RDW: 14.9 % (ref 11.5–15.5)
WBC: 9.1 10*3/uL (ref 4.0–10.5)
nRBC: 0 % (ref 0.0–0.2)

## 2021-02-26 LAB — COMPREHENSIVE METABOLIC PANEL
ALT: 95 U/L — ABNORMAL HIGH (ref 0–44)
AST: 39 U/L (ref 15–41)
Albumin: 3.7 g/dL (ref 3.5–5.0)
Alkaline Phosphatase: 206 U/L — ABNORMAL HIGH (ref 38–126)
Anion gap: 13 (ref 5–15)
BUN: 20 mg/dL (ref 8–23)
CO2: 21 mmol/L — ABNORMAL LOW (ref 22–32)
Calcium: 9.3 mg/dL (ref 8.9–10.3)
Chloride: 100 mmol/L (ref 98–111)
Creatinine, Ser: 1.09 mg/dL — ABNORMAL HIGH (ref 0.44–1.00)
GFR, Estimated: 55 mL/min — ABNORMAL LOW (ref >60)
Glucose, Bld: 131 mg/dL — ABNORMAL HIGH (ref 70–99)
Potassium: 4 mmol/L (ref 3.5–5.1)
Sodium: 134 mmol/L — ABNORMAL LOW (ref 135–145)
Total Bilirubin: 1.3 mg/dL — ABNORMAL HIGH (ref 0.3–1.2)
Total Protein: 8.2 g/dL — ABNORMAL HIGH (ref 6.5–8.1)

## 2021-02-26 LAB — RESP PANEL BY RT-PCR (FLU A&B, COVID) ARPGX2
Influenza A by PCR: NEGATIVE
Influenza B by PCR: NEGATIVE
SARS Coronavirus 2 by RT PCR: NEGATIVE

## 2021-02-26 LAB — APTT: aPTT: 35 seconds (ref 24–36)

## 2021-02-26 LAB — LACTIC ACID, PLASMA: Lactic Acid, Venous: 1.9 mmol/L (ref 0.5–1.9)

## 2021-02-26 LAB — PROTIME-INR
INR: 1.2 (ref 0.8–1.2)
Prothrombin Time: 14.5 seconds (ref 11.4–15.2)

## 2021-02-26 LAB — LIPASE, BLOOD: Lipase: 29 U/L (ref 11–51)

## 2021-02-26 IMAGING — CR DG CHEST 2V
2 series · 2 of 2 positions shown · non-contrast
Comparison: None.

CLINICAL DATA: Code sepsis

EXAM:
CHEST - 2 VIEW

[chest lat]
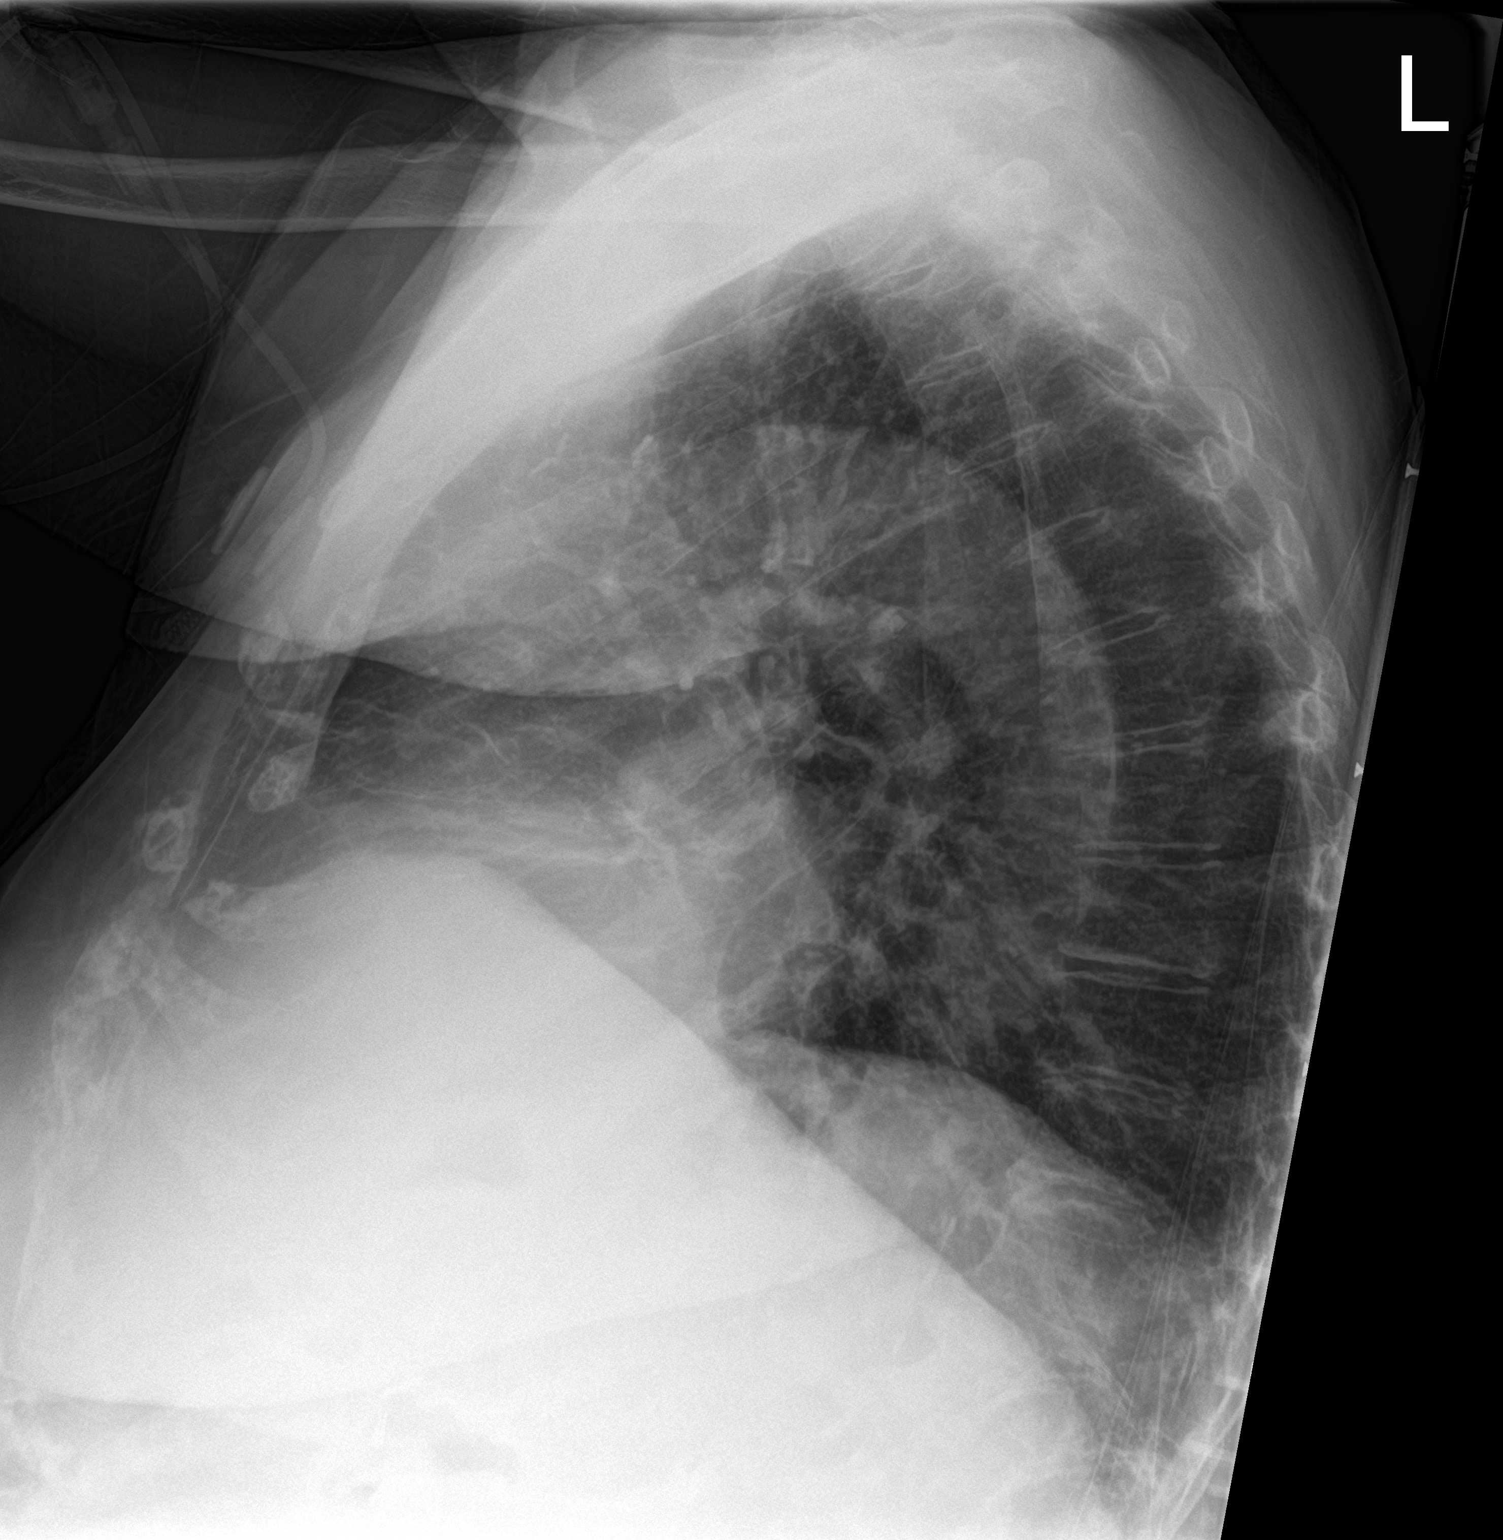

[chest ap]
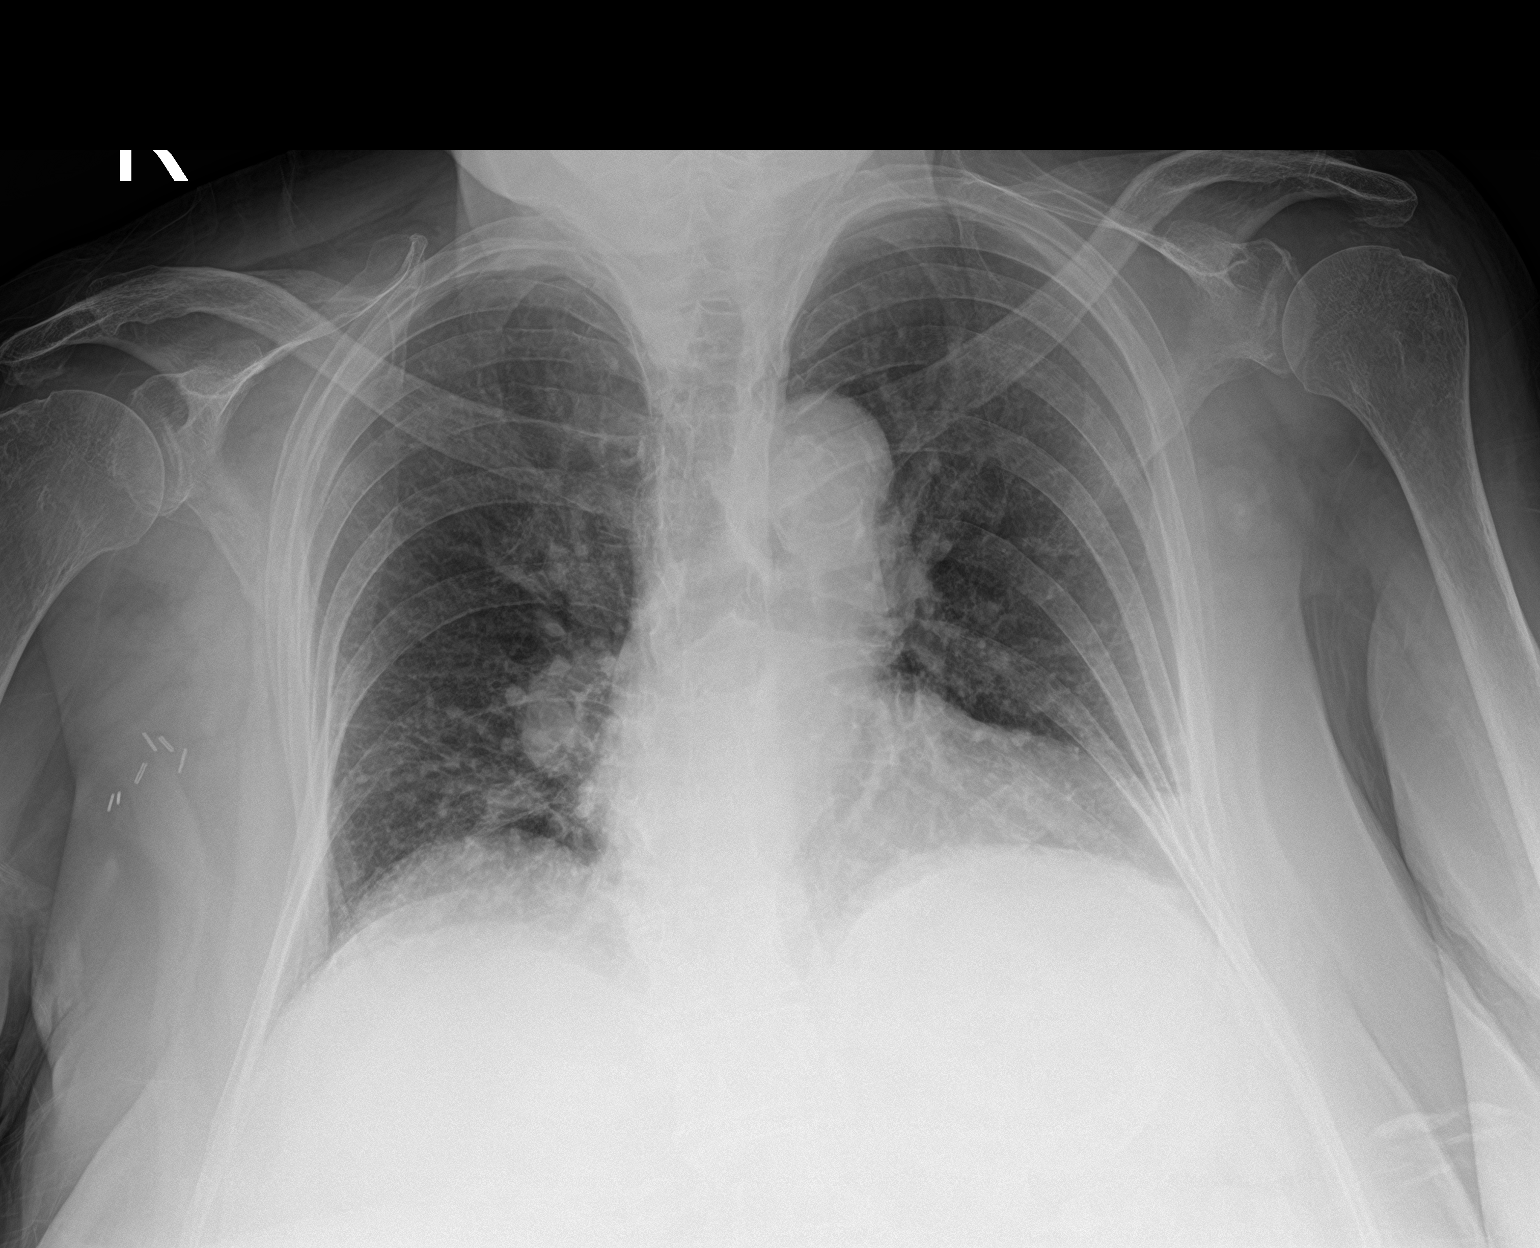

[2 of 2 positions shown; findings below may reference images not displayed]

FINDINGS: Low lung volumes. Streaky atelectasis at the bases. No
consolidation. Borderline cardiomegaly. Aortic atherosclerosis. No
pneumothorax.
IMPRESSION: Low lung volumes with streaky bibasilar atelectasis.

## 2021-02-26 MED ORDER — LACTATED RINGERS IV SOLN: INTRAVENOUS | Status: DC

## 2021-02-26 MED ORDER — FENTANYL CITRATE (PF) 100 MCG/2ML IJ SOLN
25.0000 ug | Freq: Once | INTRAMUSCULAR | Status: DC
  Filled 2021-02-26: qty 2

## 2021-02-26 MED ORDER — ONDANSETRON HCL 4 MG/2ML IJ SOLN
4.0000 mg | Freq: Once | INTRAMUSCULAR | Status: AC
  Administered 2021-02-26: 4 mg via INTRAVENOUS
  Filled 2021-02-26: qty 2

## 2021-02-26 MED ORDER — SODIUM CHLORIDE 0.9 % IV SOLN
2.0000 g | Freq: Once | INTRAVENOUS | Status: AC
  Administered 2021-02-26: 2 g via INTRAVENOUS
  Filled 2021-02-26: qty 2

## 2021-02-26 MED ORDER — FENTANYL CITRATE (PF) 100 MCG/2ML IJ SOLN
25.0000 ug | Freq: Once | INTRAMUSCULAR | Status: AC
  Administered 2021-02-26: 25 ug via INTRAVENOUS
  Filled 2021-02-26: qty 2

## 2021-02-26 MED ORDER — METRONIDAZOLE IN NACL 5-0.79 MG/ML-% IV SOLN
500.0000 mg | Freq: Once | INTRAVENOUS | Status: AC
  Administered 2021-02-26: 500 mg via INTRAVENOUS
  Filled 2021-02-26: qty 100

## 2021-02-26 MED ORDER — ACETAMINOPHEN 10 MG/ML IV SOLN
1000.0000 mg | INTRAVENOUS | Status: AC
  Administered 2021-02-26: 1000 mg via INTRAVENOUS
  Filled 2021-02-26: qty 100

## 2021-02-26 MED ORDER — LACTATED RINGERS IV BOLUS
1000.0000 mL | Freq: Once | INTRAVENOUS | Status: AC
  Administered 2021-02-26: 1000 mL via INTRAVENOUS

## 2021-02-26 MED ORDER — ONDANSETRON 4 MG PO TBDP
4.0000 mg | ORAL_TABLET | Freq: Once | ORAL | Status: AC | PRN
  Administered 2021-02-26: 4 mg via ORAL
  Filled 2021-02-26: qty 1

## 2021-02-26 NOTE — Consult Note (Signed)
PHARMACY -  BRIEF ANTIBIOTIC NOTE   Pharmacy has received consult(s) for Cefepime from an ED provider.  The patient's profile has been reviewed for ht/wt/allergies/indication/available labs.    One time order(s) placed for cefepime 2 gram  Further antibiotics/pharmacy consults should be ordered by admitting physician if indicated.                       Thank you, Dorothe Pea, PharmD, BCPS Clinical Pharmacist  02/26/2021  5:14 PM

## 2021-02-26 NOTE — Consult Note (Signed)
CODE SEPSIS - PHARMACY COMMUNICATION  **Broad Spectrum Antibiotics should be administered within 1 hour of Sepsis diagnosis**  Time Code Sepsis Called/Page Received: 1629  Antibiotics Ordered: cefepime, metronidazole  Time of 1st antibiotic administration: 1721  Additional action taken by pharmacy: N/A  If necessary, Name of Provider/Nurse Contacted: [place] ,PharmD Clinical Pharmacist  02/26/2021  5:36 PM

## 2021-02-26 NOTE — ED Provider Notes (Signed)
[facility] Emergency Department Provider Note   ____________________________________________   Event Date/Time   First MD Initiated Contact with Patient 02/26/21 1627     (approximate)  I have reviewed the triage vital signs and the nursing notes.   HISTORY  Chief Complaint Emesis, Fever, and Abdominal Pain  Offered patient interpreter, she declined this prefers to use English  HPI Pamela Trevino is a 70 y.o. female history of remote breast cancer, hypertension  Patient reports since Thursday been having pain in her right abdomen mostly in the right upper abdomen.  Associated with vomiting.  Fevers and chills and fatigue.  Reports moderate to severe pain mostly in the right side of the abdomen   Pain is constant located in the right upper abdomen associated with nausea vomiting and also a few loose stools.  She has had a fever.  No chest pain or trouble breathing.  Past Medical History:  Diagnosis Date  . Breast cancer ([place]) 04/1997   right breast cancer, chemo and radiation  . Hypertension   . Personal history of chemotherapy   . Personal history of radiation therapy     There are no problems to display for this patient.   Past Surgical History:  Procedure Laterality Date  . BREAST BIOPSY Left 1998   negative  . BREAST BIOPSY Right 1998   positive  . BREAST LUMPECTOMY Right 1998   lumpectomy with chemo and rad tx    Prior to Admission medications   Medication Sig Start Date End Date Taking? Authorizing Provider  aspirin 81 MG EC tablet Take 1 tablet by mouth daily. 11/12/20   [provider]  atorvastatin (LIPITOR) 20 MG tablet Take by mouth. 12/07/20   [provider]  diclofenac (VOLTAREN) 75 MG EC tablet Take by mouth. 09/24/20 09/24/21  [provider]  losartan-hydrochlorothiazide (HYZAAR) 100-12.5 MG tablet Take 1 tablet by mouth daily. 06/01/20 06/01/21  [provider]  omeprazole (PRILOSEC) 40 MG  capsule Take by mouth. 11/26/20   [provider]  predniSONE (STERAPRED UNI-PAK 21 TAB) 10 MG (21) TBPK tablet Take by mouth daily. Take 6 tabs by mouth daily  for 2 days, then 5 tabs for 2 days, then 4 tabs for 2 days, then 3 tabs for 2 days, 2 tabs for 2 days, then 1 tab by mouth daily for 2 days 12/18/20   Verda Cumins, MD  Vitamin D, Ergocalciferol, (DRISDOL) 1.25 MG (50000 UNIT) CAPS capsule Take 50,000 Units by mouth once a week. 12/03/20   [provider]    Allergies Oxycodone  Family History  Problem Relation Age of Onset  . Breast cancer Neg Hx     Social History Social History   Tobacco Use  . Smoking status: Never Smoker  . Smokeless tobacco: Never Used  Vaping Use  . Vaping Use: Never used  Substance Use Topics  . Alcohol use: Yes  . Drug use: Not Currently    Review of Systems Constitutional: Fevers and Eyes: No visual changes. ENT: No sore throat. Cardiovascular: Denies chest pain. Respiratory: Denies shortness of breath. Gastrointestinal: See HPI genitourinary: Negative for dysuria.  He does report she has been urinating less and has not been able to keep fluids down Musculoskeletal: Negative for back pain. Skin: Negative for rash. Neurological: Negative for headaches, areas of focal weakness or numbness.    ____________________________________________   PHYSICAL EXAM:  VITAL SIGNS: ED Triage Vitals  Enc Vitals Group     BP 02/26/21  1608 (!) 162/86     Pulse Rate 02/26/21 1608 (!) 132     Resp 02/26/21 1608 (!) 24     Temp 02/26/21 1608 (!) 103 F (39.4 C)     Temp Source 02/26/21 1608 Oral     SpO2 02/26/21 1608 98 %     Weight 02/26/21 1618 207 lb 3.7 oz (94 kg)     Height 02/26/21 1618 5\' 5"  (1.651 m)     Head Circumference --      Peak Flow --      Pain Score 02/26/21 1616 10     Pain Loc --      Pain Edu? --      Excl. in [place]? --     Constitutional: Alert and oriented.  Ill-appearing.  She is alert able to have  conversation, but would not converse to prefers to keep eyes closed holds hand over her abdomen moans in pain. Eyes: Conjunctivae are normal. Head: Atraumatic. Nose: No congestion/rhinnorhea. Mouth/Throat: Mucous membranes are dry Neck: No stridor.  Cardiovascular: Tachycardic rate, regular rhythm. Grossly normal heart sounds.  Good peripheral circulation. Respiratory: Normal respiratory effort.  No retractions. Lungs CTAB. Gastrointestinal: Somewhat diffusely tender over the right side of the abdomen upper and lower also slight in the epigastrium.  Some tenderness across the lower abdomen as well upper and lower but not as notable as in the right more so right upper quadrant.  Difficult to assess for positive Murphy given pain is somewhat diffuse. No distention. Musculoskeletal: No lower extremity tenderness nor edema. Neurologic:  Normal speech and language. No gross focal neurologic deficits are appreciated.  Skin:  Skin is very warm, dry and intact. No rash noted. Psychiatric: Mood and affect are normal. Speech and behavior are normal.  ____________________________________________   LABS (all labs ordered are listed, but only abnormal results are displayed)  Labs Reviewed  COMPREHENSIVE METABOLIC PANEL - Abnormal; Notable for the following components:      Result Value   Sodium 134 (*)    CO2 21 (*)    Glucose, Bld 131 (*)    Creatinine, Ser 1.09 (*)    Total Protein 8.2 (*)    ALT 95 (*)    Alkaline Phosphatase 206 (*)    Total Bilirubin 1.3 (*)    GFR, Estimated 55 (*)    All other components within normal limits  URINALYSIS, COMPLETE (UACMP) WITH MICROSCOPIC - Abnormal; Notable for the following components:   Color, Urine YELLOW (*)    APPearance HAZY (*)    Specific Gravity, Urine >1.046 (*)    Hgb urine dipstick MODERATE (*)    Ketones, ur 5 (*)    Protein, ur 100 (*)    All other components within normal limits  RESP PANEL BY RT-PCR (FLU A&B, COVID) ARPGX2  CULTURE,  BLOOD (ROUTINE X 2)  CULTURE, BLOOD (ROUTINE X 2)  LIPASE, BLOOD  CBC  LACTIC ACID, PLASMA  PROTIME-INR  APTT  LACTIC ACID, PLASMA   ____________________________________________  EKG  Reviewed interval at 1620 Heart rate 130 QRS 89 QTc 420 Sinus tachycardia ____________________________________________  RADIOLOGY  DG Chest 2 View  Result Date: 02/26/2021 CLINICAL DATA:  Code sepsis EXAM: CHEST - 2 VIEW COMPARISON:  None. FINDINGS: Low lung volumes. Streaky atelectasis at the bases. No consolidation. Borderline cardiomegaly. Aortic atherosclerosis. No pneumothorax. IMPRESSION: Low lung volumes with streaky bibasilar atelectasis. Electronically Signed   By: Donavan Foil M.D.   On: 02/26/2021 17:06     Also  reviewed CT imaging from today in care everywhere done in the [facility] system ____________________________________________   PROCEDURES  Procedure(s) performed: None  Procedures  Critical Care performed: Yes, see critical care note(s)  CRITICAL CARE Performed by: Delman Kitten   Total critical care time: 35 minutes  Critical care time was exclusive of separately billable procedures and treating other patients.  Critical care was necessary to treat or prevent imminent or life-threatening deterioration.  Critical care was time spent personally by me on the following activities: development of treatment plan with patient and/or surrogate as well as nursing, discussions with consultants, evaluation of patient's response to treatment, examination of patient, obtaining history from patient or surrogate, ordering and performing treatments and interventions, ordering and review of laboratory studies, ordering and review of radiographic studies, pulse oximetry and re-evaluation of patient's condition.  ____________________________________________   INITIAL IMPRESSION / ASSESSMENT AND PLAN / ED COURSE  Pertinent labs & imaging results that were available during my care of the  patient were reviewed by me and considered in my medical decision making (see chart for details).   Differential diagnosis includes but is not limited to, abdominal perforation, aortic dissection, cholecystitis, appendicitis, diverticulitis, colitis, esophagitis/gastritis, kidney stone, pyelonephritis, urinary tract infection, aortic aneurysm. All are considered in decision and treatment plan. Based upon the patient's presentation and risk factors, I am very concerned about sepsis from intra-abdominal source quite possibly right upper quadrant.  We will proceed with broad-spectrum antibiotics, IV fluids, and anticipate CT scan to further evaluate.   Clinical Course as of 02/26/21 2026  Tue Feb 26, 2021  1707 There is imaging from [facility] done today in Care Everywhere: "IMPRESSION: There is a large mixed density mass with calcification, soft tissue and other areas of internal cystic attenuation measuring up to 20 cm occupying the left pelvis and emanating up into the left mid abdomen. There is no definable left ovary. Left ovarian mass is favored though admittedly, it may be difficult to define any communication with the uterine fundus based on today's CT imaging alone. Additionally, there is haziness and thickening of the mesenteric fat most evident in the right upper quadrant. Whether this is a small amount of mesenteric fluid or omental thickening and peritoneal seeding is indeterminate. GYN referral is recommended." [MQ]  3086 Discussed with Dr. Dahlia Byes (Surgery), reviewed case, CT scan results from [place] today. He recommends no acute surgery here but needs transfer to center with GYN oncology services.  [MQ]  1715 WBC 15.6 at [facility] with 84% neutrophils per [facility] I spoke with her PCP at [facility] today and he affirms concerns and CT findings, agrees she may need GYN spcielty care .  [MQ]  65 Spoke with PCP (Dr. Johny Shock) [MQ]  [phone] Discussed results of CT scan with the patient and her husband, in  light of these findings and her limited services of not having GYN oncology discussed transfer to a appropriate center.  Patient is a patient at [facility] and she would prefer to go to [facility], if unable to go to [place] would entertain going to [facility] in [place]. [MQ]  88 Called spoke with [facility] transfer center [MQ]    Clinical Course User Index [MQ] Delman Kitten, MD    ----------------------------------------- 6:41 PM on 02/26/2021 -----------------------------------------  Spoke with Dr. Riki Sheer of GYN oncology, he advises agreeable with transfer to [facility] ER for further work-up and evaluation and availability of GYN oncology consult.  Patient accepted to the service of GYN Dr. Jaynee Eagles.   -----------------------------------------  8:27 PM on 02/26/2021 -----------------------------------------  Of [facility] team has arrived for transport to [facility]  Patient reports mild to moderate pain ongoing, but better than when she arrived.  Due to anticipated discomfort and pain with movement and transport discussed with the patient and we will give additional small fentanyl dose prior to transport for comfort.  She is alert oriented hemodynamics improving.  Certainly stable for transfer to [place] in the care of ALS team ____________________________________________   FINAL CLINICAL IMPRESSION(S) / ED DIAGNOSES  Final diagnoses:  Abdominal mass, unspecified abdominal location  Fever, unspecified fever cause  Sepsis, due to unspecified organism, unspecified whether acute organ dysfunction present [facility])        Note:  This document was prepared using Dragon voice recognition software and may include unintentional dictation errors       Delman Kitten, MD 02/26/21 2028

## 2021-02-26 NOTE — Consult Note (Signed)
CODE SEPSIS - PHARMACY COMMUNICATION  **Broad Spectrum Antibiotics should be administered within 1 hour of Sepsis diagnosis**  Time Code Sepsis Called/Page Received: 1629  Antibiotics Ordered: cefepime, metronidazole  Time of 1st antibiotic administration: 1721  Additional action taken by pharmacy: n/a  If necessary, Name of Provider/Nurse Contacted: [place] ,PharmD, BCPS Clinical Pharmacist  02/26/2021  4:52 PM

## 2021-02-26 NOTE — Progress Notes (Signed)
Code sepsis being monitored by [employer]

## 2021-02-26 NOTE — Consult Note (Signed)
CODE SEPSIS - PHARMACY COMMUNICATION  **Broad Spectrum Antibiotics should be administered within 1 hour of Sepsis diagnosis**  Time Code Sepsis Called/Page Received: 1629  Antibiotics Ordered: cefepime, metronidazole  Time of 1st antibiotic administration: 1721  Additional action taken by pharmacy: n/a  If necessary, Name of Provider/Nurse Contacted: [place] ,PharmD, BCPS Clinical Pharmacist  02/26/2021  7:13 PM

## 2021-02-26 NOTE — ED Notes (Signed)
Sent rainbow, gray on ice, and 1 set of Blood Cultures to the lab.

## 2021-02-26 NOTE — ED Notes (Signed)
CXR powered shared to [facility]

## 2021-02-26 NOTE — ED Notes (Signed)
Called [facility] for transfer [phone]

## 2021-02-26 NOTE — ED Triage Notes (Signed)
Patient presents to the ED with right upper quadrant abdominal pain, nausea, vomiting and fever since last Thursday.  Patient appears pale, weak and tired in triage.  Patient keeping eyes closed unless spoken to and patient is tachypnic and tachycardic.

## 2021-02-26 NOTE — ED Notes (Signed)
Accepted ED to ED [place] coordinator, Dr. Riki Sheer  [phone] report

## 2021-02-27 ENCOUNTER — Other Ambulatory Visit: Payer: Self-pay | Admitting: Emergency Medicine

## 2021-02-27 ENCOUNTER — Ambulatory Visit
Admission: RE | Admit: 2021-02-27 | Discharge: 2021-02-27 | Disposition: A | Discharge status: Home / Self Care | Payer: Self-pay | Source: Ambulatory Visit | Attending: Emergency Medicine | Admitting: Emergency Medicine

## 2021-02-27 DIAGNOSIS — R52 Pain, unspecified: Secondary | ICD-10-CM

## 2021-03-02 LAB — CULTURE, BLOOD (ROUTINE X 2)
Culture  Setup Time: NONE SEEN
Special Requests: ADEQUATE

## 2021-03-03 NOTE — Progress Notes (Signed)
ED Antimicrobial Stewardship Positive Culture Follow Up   Pamela Trevino is an 70 y.o. female who presented to [facility] on 02/26/2021 with a chief complaint of  Chief Complaint  Patient presents with  . Emesis  . Fever  . Abdominal Pain    Recent Results (from the past 720 hour(s))  Culture, blood (Routine x 2)     Status: Abnormal   Collection Time: 02/26/21  4:04 PM   Specimen: BLOOD  Result Value Ref Range Status   Specimen Description   Final    BLOOD RIGHT ANTECUBITAL Performed at [facility], [address] [place], [place] [address]    Special Requests   Final    BOTTLES DRAWN AEROBIC AND ANAEROBIC Blood Culture adequate volume Performed at [facility], [address] [place], [place] [address]    Culture  Setup Time   Final    NO ORGANISMS SEEN Performed at [facility], [address], [place], [place] [address]    Culture (A)  Final    FUSOBACTERIUM NUCLEATUM CRITICAL RESULT CALLED TO, READ BACK BY AND VERIFIED WITH: Shawnie Pons RN AT 5631 03/02/21 BY Rush Landmark Performed at [facility], [place] [address], [place], [place] [address]    Report Status 03/02/2021 FINAL  Final  Culture, blood (Routine x 2)     Status: Abnormal (Preliminary result)   Collection Time: 02/26/21  4:45 PM   Specimen: BLOOD  Result Value Ref Range Status   Specimen Description   Final    BLOOD LEFT ANTECUBITAL Performed at [facility], [address] [place], [place] [address]    Special Requests   Final    BOTTLES DRAWN AEROBIC AND ANAEROBIC Blood Culture results may not be optimal due to an inadequate volume of blood received in culture bottles Performed at [facility], [address], [place], [place] [address]    Culture (A)  Final    FUSOBACTERIUM NUCLEATUM CRITICAL RESULT CALLED TO, READ BACK BY AND VERIFIED WITH: Shawnie Pons RN AT 7741 03/02/21 BY Rush Landmark Performed at [facility], [place] [address] [place], [place]  [address]    Report Status PENDING  Incomplete  Resp Panel by RT-PCR (Flu A&B, Covid) Nasopharyngeal Swab     Status: None   Collection Time: 02/26/21  4:45 PM   Specimen: Nasopharyngeal Swab; Nasopharyngeal(NP) swabs in vial transport medium  Result Value Ref Range Status   SARS Coronavirus 2 by RT PCR NEGATIVE NEGATIVE Final    Comment: (NOTE) SARS-CoV-2 target nucleic acids are NOT DETECTED.  The SARS-CoV-2 RNA is generally detectable in upper respiratory specimens during the acute phase of infection. The lowest concentration of SARS-CoV-2 viral copies this assay can detect is 138 copies/mL. A negative result does not preclude SARS-Cov-2 infection and should not be used as the sole basis for treatment or other patient management decisions. A negative result may occur with  improper specimen collection/handling, submission of specimen other than nasopharyngeal swab, presence of viral mutation(s) within the areas targeted by this assay, and inadequate number of viral copies(<138 copies/mL). A negative result must be combined with clinical observations, patient history, and epidemiological information. The expected result is Negative.  Fact Sheet for Patients:  EntrepreneurPulse.com.au  Fact Sheet for Healthcare Providers:  IncredibleEmployment.be  This test is no t yet approved or cleared by the [place] FDA and  has been authorized for detection and/or diagnosis of SARS-CoV-2 by FDA under an Emergency Use Authorization (  EUA). This EUA will remain  in effect (meaning this test can be used) for the duration of the COVID-19 declaration under Section 564(b)(1) of the Act, 21 U.S.C.section 360bbb-3(b)(1), unless the authorization is terminated  or revoked sooner.       Influenza A by PCR NEGATIVE NEGATIVE Final   Influenza B by PCR NEGATIVE NEGATIVE Final    Comment: (NOTE) The Xpert Xpress SARS-CoV-2/FLU/RSV plus assay is intended as an  aid in the diagnosis of influenza from Nasopharyngeal swab specimens and should not be used as a sole basis for treatment. Nasal washings and aspirates are unacceptable for Xpert Xpress SARS-CoV-2/FLU/RSV testing.  Fact Sheet for Patients: EntrepreneurPulse.com.au  Fact Sheet for Healthcare Providers: IncredibleEmployment.be  This test is not yet approved or cleared by the [place] FDA and has been authorized for detection and/or diagnosis of SARS-CoV-2 by FDA under an Emergency Use Authorization (EUA). This EUA will remain in effect (meaning this test can be used) for the duration of the COVID-19 declaration under Section 564(b)(1) of the Act, 21 U.S.C. section 360bbb-3(b)(1), unless the authorization is terminated or revoked.  Performed at [facility], [address], [place], [place] [address]     Patient transferred to [facility] 02/26/21. 03/03/21 [place] appears pt in [facility] N6300. Spoke with RN and relayed info for above blood cx results to be relayed to provider. Per chart notes pt on Cipro/Metronidazole at [facility].   Donae Kueker A 03/03/2021, 9:32 AM Clinical Pharmacist

## 2021-03-05 LAB — CULTURE, BLOOD (ROUTINE X 2)

## 2021-06-19 ENCOUNTER — Other Ambulatory Visit: Payer: Self-pay | Admitting: Physician Assistant

## 2021-06-19 DIAGNOSIS — Z1231 Encounter for screening mammogram for malignant neoplasm of breast: Secondary | ICD-10-CM

## 2021-07-03 ENCOUNTER — Ambulatory Visit
Admission: RE | Admit: 2021-07-03 | Discharge: 2021-07-03 | Disposition: A | Discharge status: Home / Self Care | Payer: Medicare Other | Source: Ambulatory Visit | Attending: Physician Assistant | Admitting: Physician Assistant

## 2021-07-03 ENCOUNTER — Other Ambulatory Visit: Payer: Self-pay

## 2021-07-03 DIAGNOSIS — Z1231 Encounter for screening mammogram for malignant neoplasm of breast: Secondary | ICD-10-CM | POA: Diagnosis not present

## 2021-07-03 IMAGING — MG MM DIGITAL SCREENING BILAT W/ TOMO AND CAD
6 of 12 series · 6 of 36 positions shown · non-contrast
Comparison: Previous exam(s).

CLINICAL DATA: Screening. Personal history of malignant lumpectomy
of the RIGHT breast in [NR].

EXAM:
DIGITAL SCREENING BILATERAL MAMMOGRAM WITH TOMOSYNTHESIS AND CAD
TECHNIQUE: Bilateral screening digital craniocaudal and mediolateral oblique
mammograms were obtained. Bilateral screening digital breast
tomosynthesis was performed. The images were evaluated with
computer-aided detection.

[L MLO synth-2D (1 of 2)]
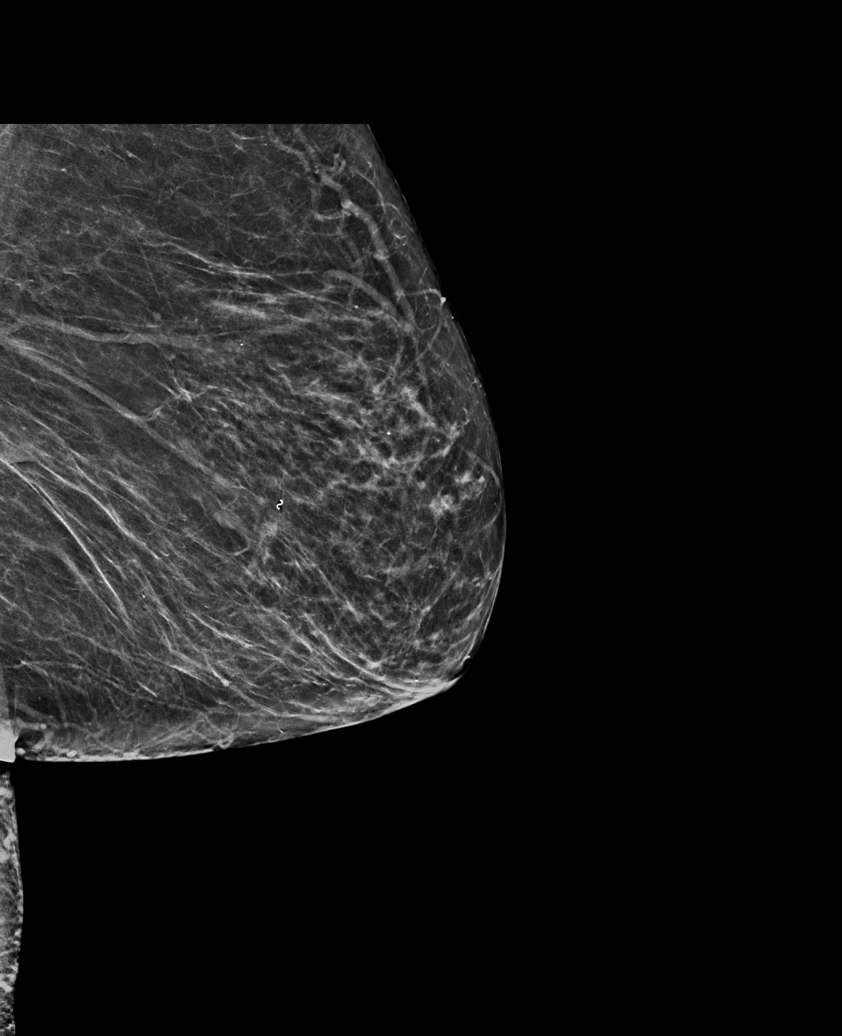

[R XCCL synth-2D]
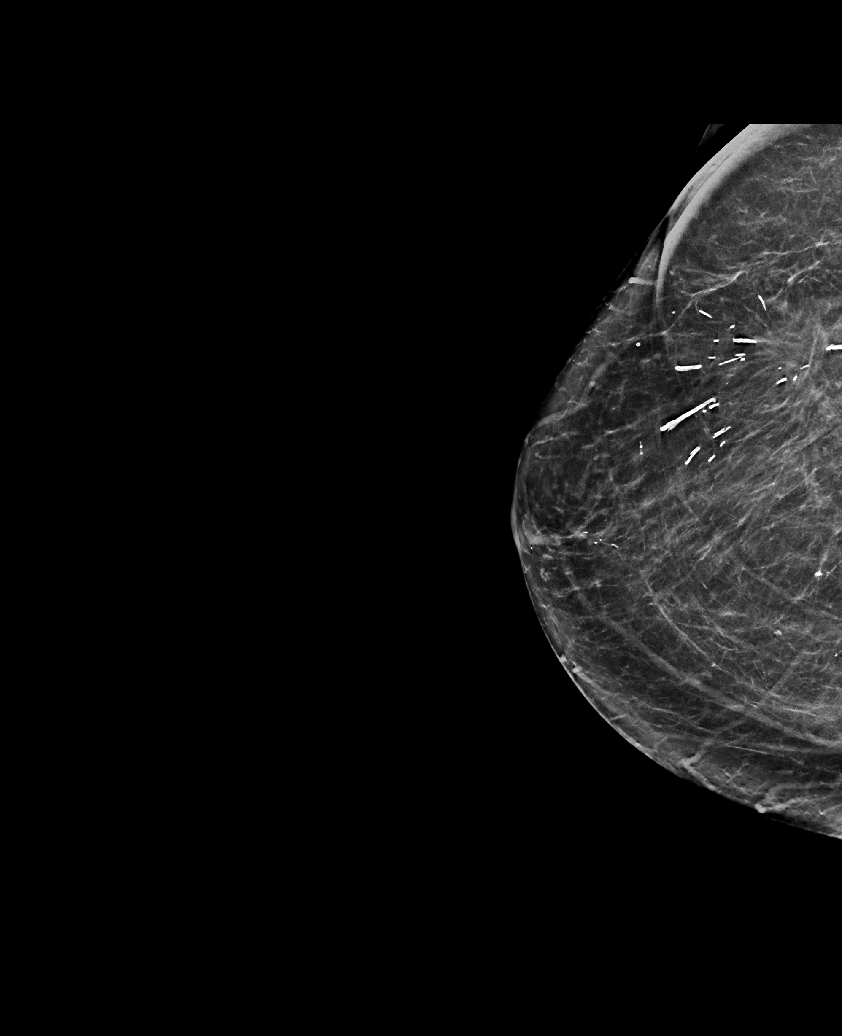

[R CC synth-2D]
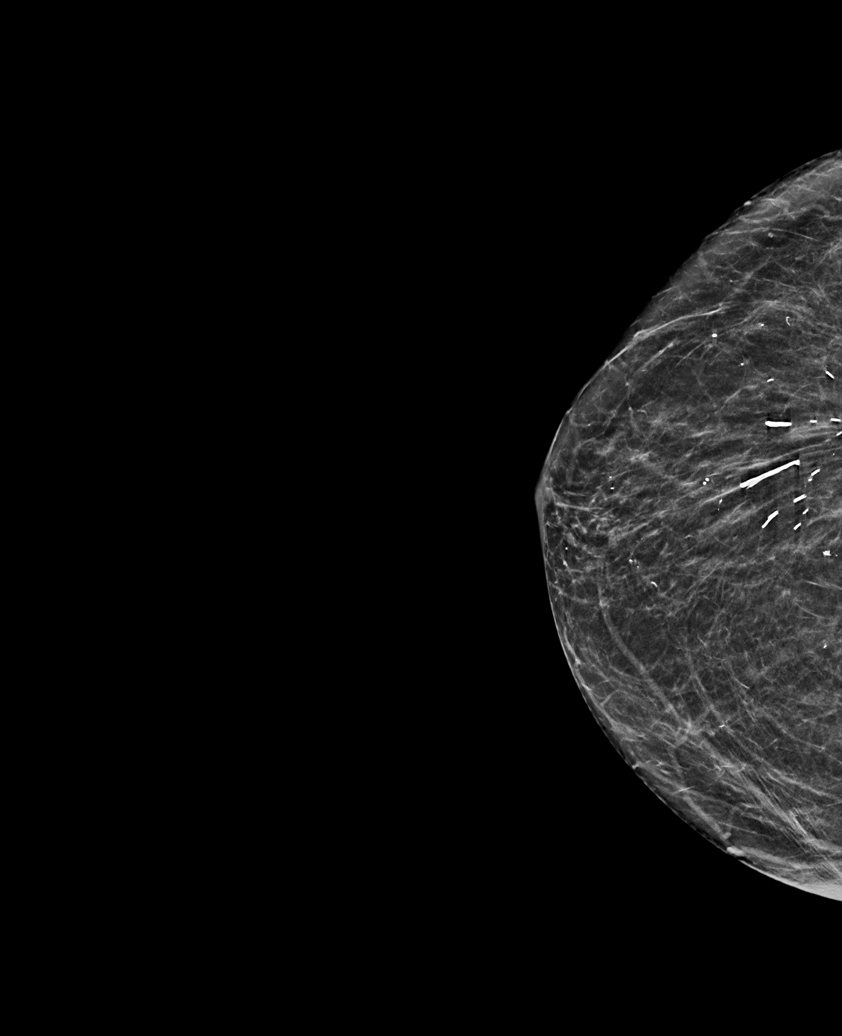

[L MLO synth-2D (2 of 2)]
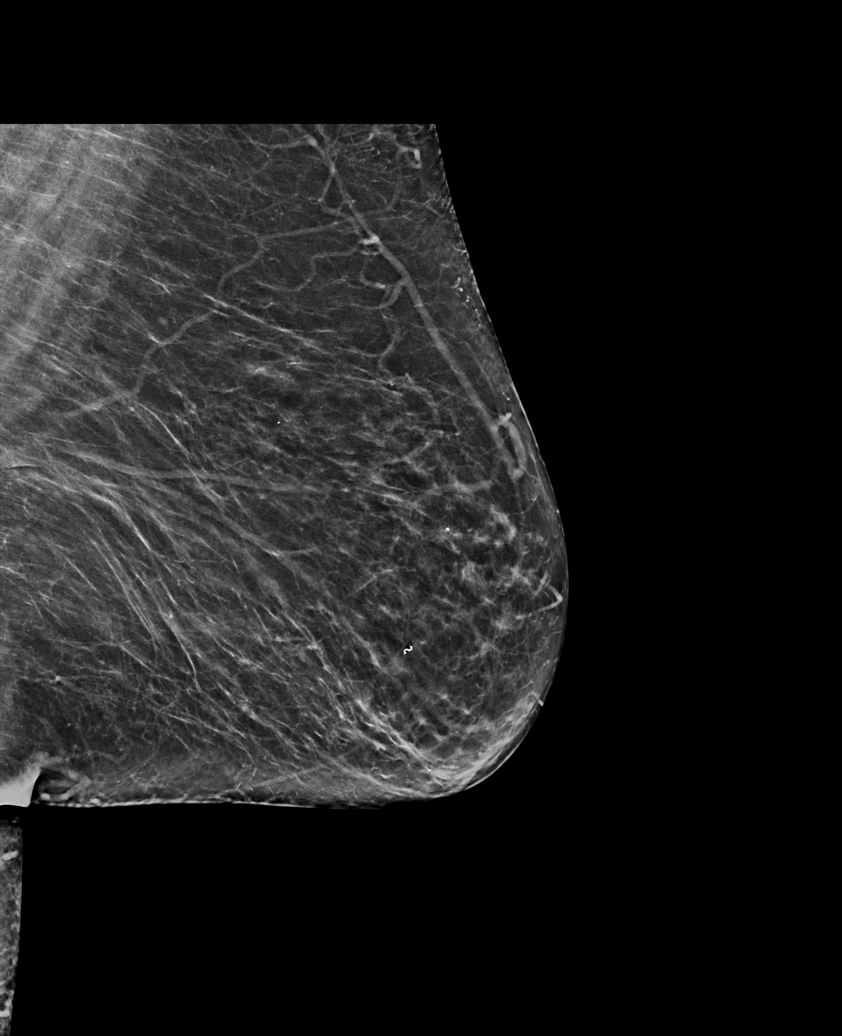

[R MLO synth-2D]
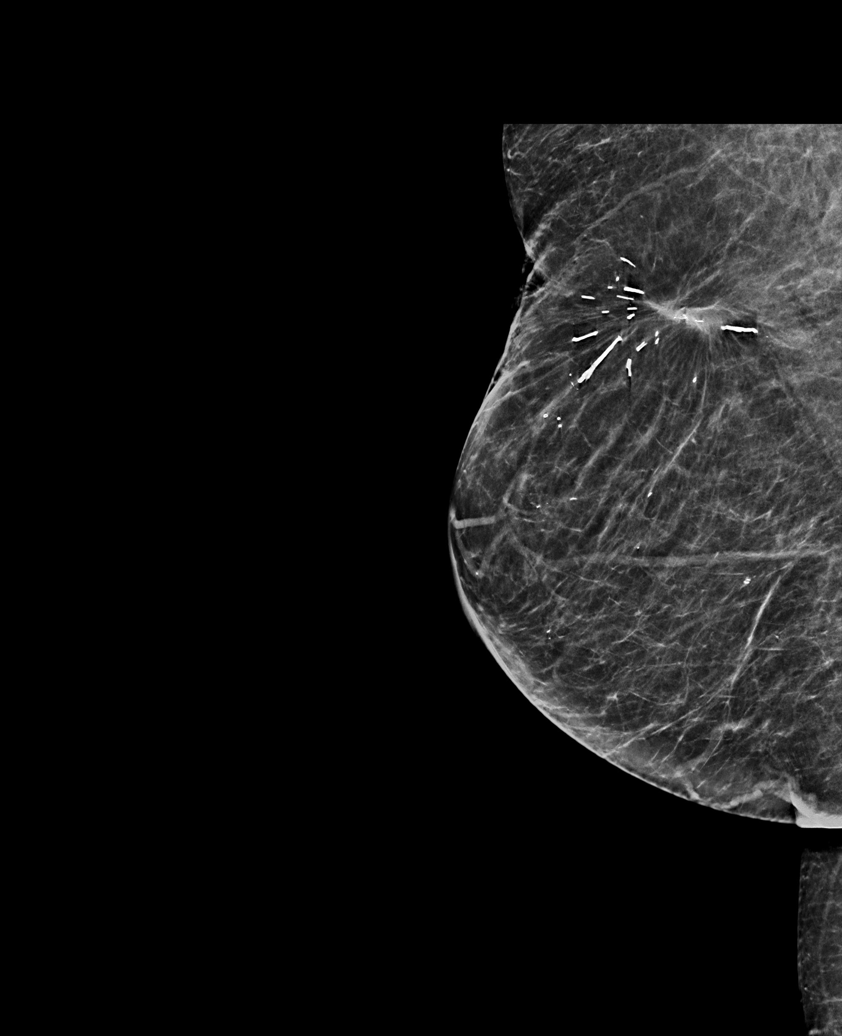

[L CC synth-2D]
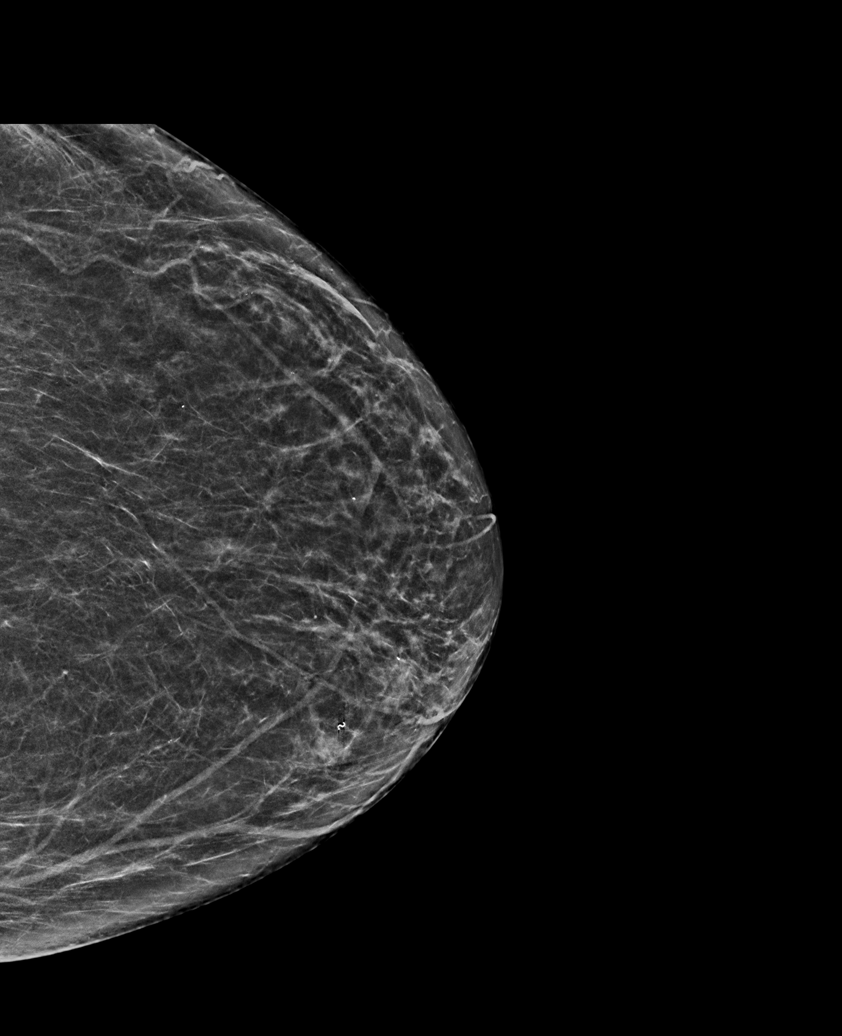

[6 of 36 positions shown; findings below may reference images not displayed]

ACR Breast Density Category b: There are scattered areas of
fibroglandular density.
FINDINGS: There are no findings suspicious for malignancy. Stable post
lumpectomy changes in the RIGHT breast.
IMPRESSION: No mammographic evidence of malignancy. A result letter of this
screening mammogram will be mailed directly to the patient.

RECOMMENDATION:
Screening mammogram in one year. (Code:[NR])

BI-RADS CATEGORY  2: Benign.

## 2022-06-06 ENCOUNTER — Inpatient Hospital Stay
Admission: EM | Admit: 2022-06-06 | Discharge: 2022-06-08 | DRG: 093 | Disposition: A | Discharge status: Home Health | Payer: Medicare Other | Attending: Hospitalist | Admitting: Hospitalist

## 2022-06-06 ENCOUNTER — Other Ambulatory Visit: Payer: Self-pay

## 2022-06-06 ENCOUNTER — Emergency Department: Payer: Medicare Other

## 2022-06-06 DIAGNOSIS — Z853 Personal history of malignant neoplasm of breast: Secondary | ICD-10-CM

## 2022-06-06 DIAGNOSIS — H5121 Internuclear ophthalmoplegia, right eye: Principal | ICD-10-CM | POA: Diagnosis present

## 2022-06-06 DIAGNOSIS — Z885 Allergy status to narcotic agent status: Secondary | ICD-10-CM

## 2022-06-06 DIAGNOSIS — H532 Diplopia: Secondary | ICD-10-CM | POA: Diagnosis not present

## 2022-06-06 DIAGNOSIS — Z9221 Personal history of antineoplastic chemotherapy: Secondary | ICD-10-CM

## 2022-06-06 DIAGNOSIS — N1831 Chronic kidney disease, stage 3a: Secondary | ICD-10-CM | POA: Diagnosis present

## 2022-06-06 DIAGNOSIS — R29818 Other symptoms and signs involving the nervous system: Secondary | ICD-10-CM

## 2022-06-06 DIAGNOSIS — I10 Essential (primary) hypertension: Secondary | ICD-10-CM

## 2022-06-06 DIAGNOSIS — Z8543 Personal history of malignant neoplasm of ovary: Secondary | ICD-10-CM

## 2022-06-06 DIAGNOSIS — I129 Hypertensive chronic kidney disease with stage 1 through stage 4 chronic kidney disease, or unspecified chronic kidney disease: Secondary | ICD-10-CM | POA: Diagnosis present

## 2022-06-06 DIAGNOSIS — Z79899 Other long term (current) drug therapy: Secondary | ICD-10-CM

## 2022-06-06 DIAGNOSIS — Z923 Personal history of irradiation: Secondary | ICD-10-CM

## 2022-06-06 DIAGNOSIS — Z79811 Long term (current) use of aromatase inhibitors: Secondary | ICD-10-CM

## 2022-06-06 DIAGNOSIS — E7800 Pure hypercholesterolemia, unspecified: Secondary | ICD-10-CM | POA: Diagnosis present

## 2022-06-06 DIAGNOSIS — Z7982 Long term (current) use of aspirin: Secondary | ICD-10-CM

## 2022-06-06 DIAGNOSIS — R110 Nausea: Secondary | ICD-10-CM

## 2022-06-06 DIAGNOSIS — R42 Dizziness and giddiness: Secondary | ICD-10-CM

## 2022-06-06 LAB — DIFFERENTIAL
Abs Immature Granulocytes: 0.06 10*3/uL (ref 0.00–0.07)
Basophils Absolute: 0.1 10*3/uL (ref 0.0–0.1)
Basophils Relative: 1 %
Eosinophils Absolute: 0.3 10*3/uL (ref 0.0–0.5)
Eosinophils Relative: 2 %
Immature Granulocytes: 1 %
Lymphocytes Relative: 17 %
Lymphs Abs: 1.8 10*3/uL (ref 0.7–4.0)
Monocytes Absolute: 0.9 10*3/uL (ref 0.1–1.0)
Monocytes Relative: 8 %
Neutro Abs: 7.4 10*3/uL (ref 1.7–7.7)
Neutrophils Relative %: 71 %

## 2022-06-06 LAB — CBC
HCT: 41.4 % (ref 36.0–46.0)
Hemoglobin: 13.2 g/dL (ref 12.0–15.0)
MCH: 30.9 pg (ref 26.0–34.0)
MCHC: 31.9 g/dL (ref 30.0–36.0)
MCV: 97 fL (ref 80.0–100.0)
Platelets: 238 10*3/uL (ref 150–400)
RBC: 4.27 MIL/uL (ref 3.87–5.11)
RDW: 13.6 % (ref 11.5–15.5)
WBC: 10.5 10*3/uL (ref 4.0–10.5)
nRBC: 0 % (ref 0.0–0.2)

## 2022-06-06 LAB — CBG MONITORING, ED: Glucose-Capillary: 112 mg/dL — ABNORMAL HIGH (ref 70–99)

## 2022-06-06 LAB — COMPREHENSIVE METABOLIC PANEL
ALT: 16 U/L (ref 0–44)
AST: 21 U/L (ref 15–41)
Albumin: 4.1 g/dL (ref 3.5–5.0)
Alkaline Phosphatase: 101 U/L (ref 38–126)
Anion gap: 7 (ref 5–15)
BUN: 30 mg/dL — ABNORMAL HIGH (ref 8–23)
CO2: 25 mmol/L (ref 22–32)
Calcium: 9.6 mg/dL (ref 8.9–10.3)
Chloride: 107 mmol/L (ref 98–111)
Creatinine, Ser: 1.07 mg/dL — ABNORMAL HIGH (ref 0.44–1.00)
GFR, Estimated: 56 mL/min — ABNORMAL LOW (ref >60)
Glucose, Bld: 112 mg/dL — ABNORMAL HIGH (ref 70–99)
Potassium: 4.3 mmol/L (ref 3.5–5.1)
Sodium: 139 mmol/L (ref 135–145)
Total Bilirubin: 0.6 mg/dL (ref 0.3–1.2)
Total Protein: 7.7 g/dL (ref 6.5–8.1)

## 2022-06-06 LAB — PROTIME-INR
INR: 1 (ref 0.8–1.2)
Prothrombin Time: 13.3 seconds (ref 11.4–15.2)

## 2022-06-06 LAB — APTT: aPTT: 31 seconds (ref 24–36)

## 2022-06-06 IMAGING — CT CT HEAD CODE STROKE
4 series · 16 of 47 positions shown, 18 images · non-contrast
Comparison: None Available.

CLINICAL DATA: Code stroke.  Acute neurologic deficit



[Series 3: head bone · axial · 0.42mm/px · z∈[+343,+375]mm · 3 of 82 slices shown]
[im 9/82  bone]
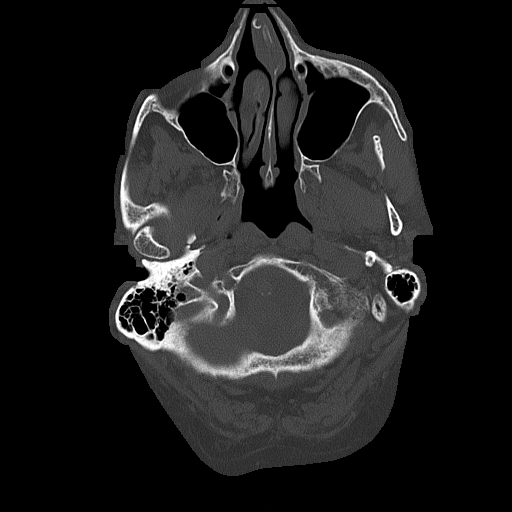
[im 17/82  bone]
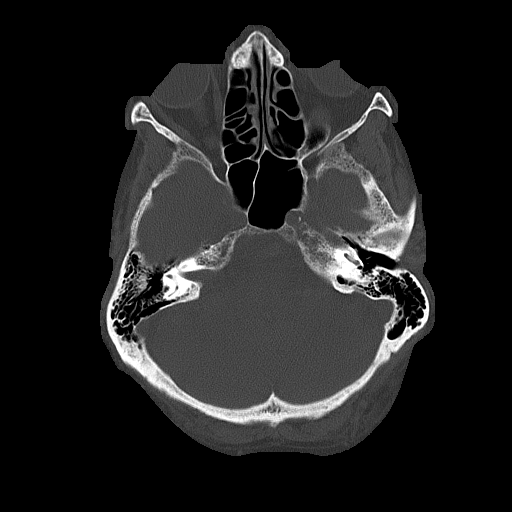
[im 25/82  bone]
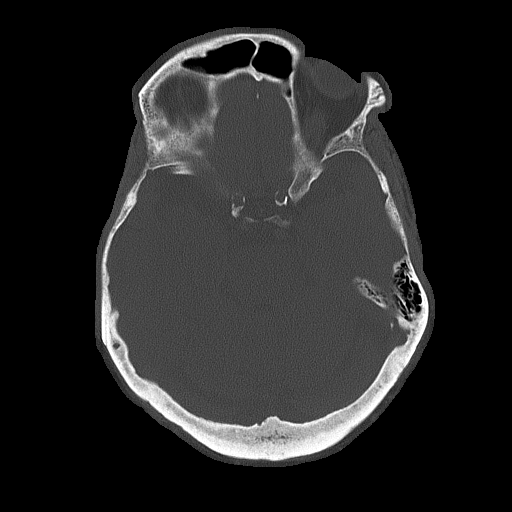

[Series 4: head wo · axial · 0.42mm/px · z∈[+347,+467]mm · 7 of 33 slices shown, 9 images]
[im 5/33  brain]
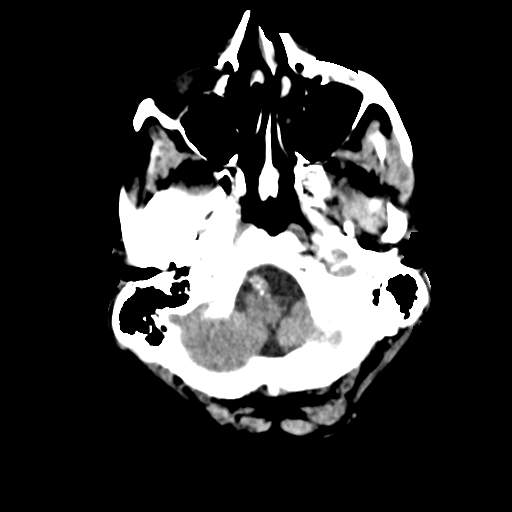
[im 5/33  bone]
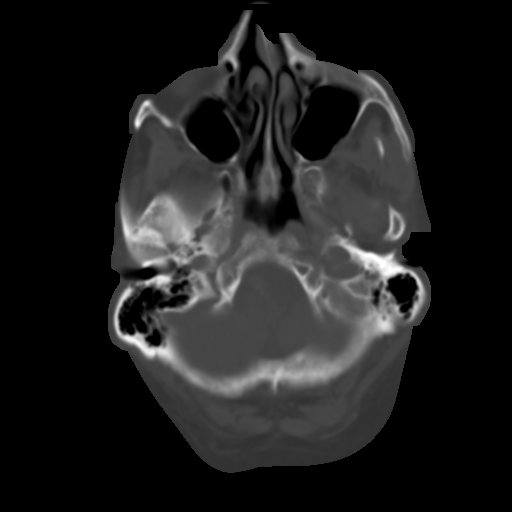
[im 9/33  brain]
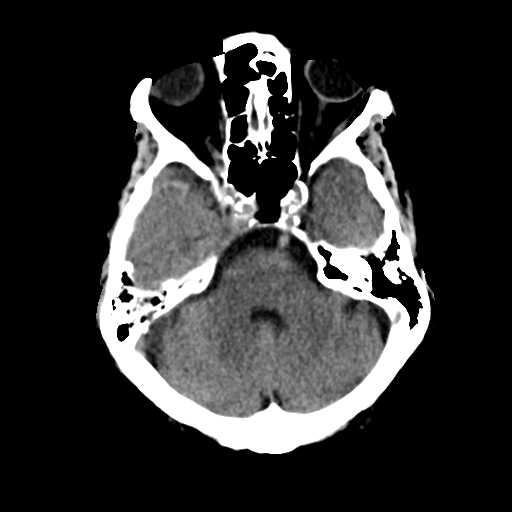
[im 13/33  brain]
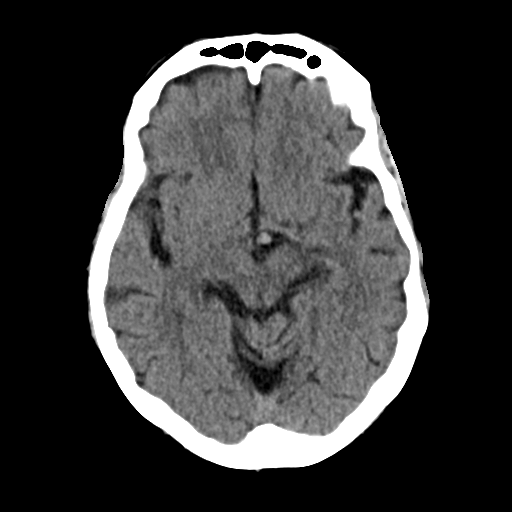
[im 17/33  brain]
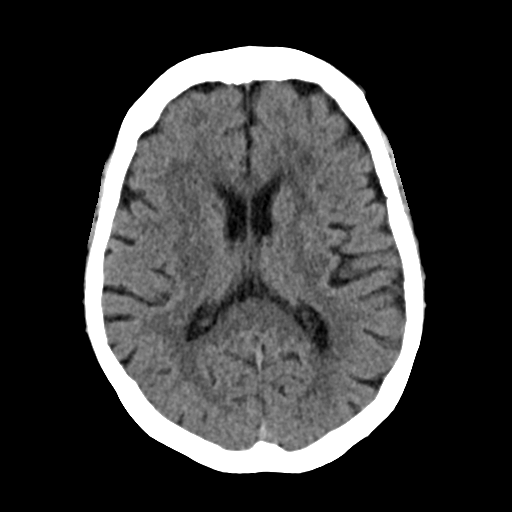
[im 21/33  brain]
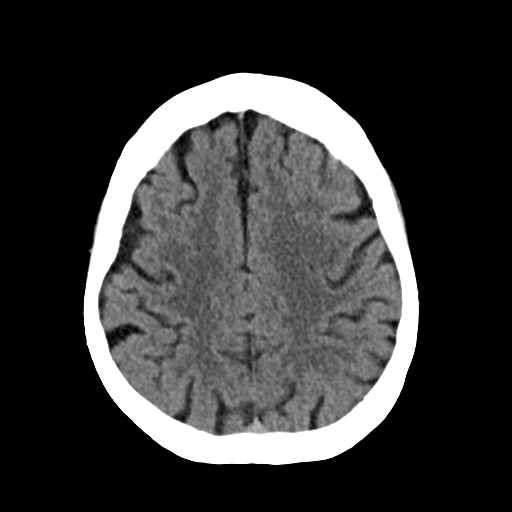
[im 21/33  bone]
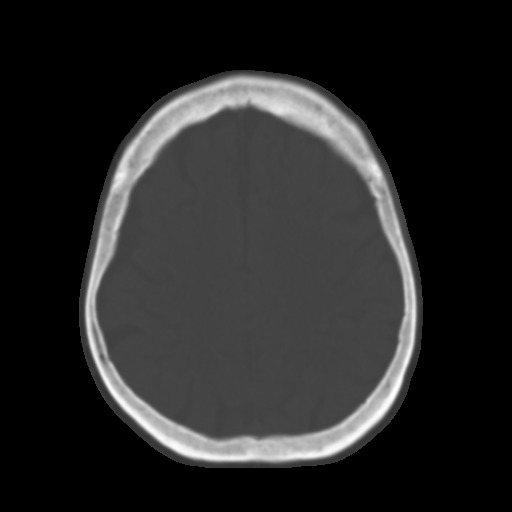
[im 25/33  brain]
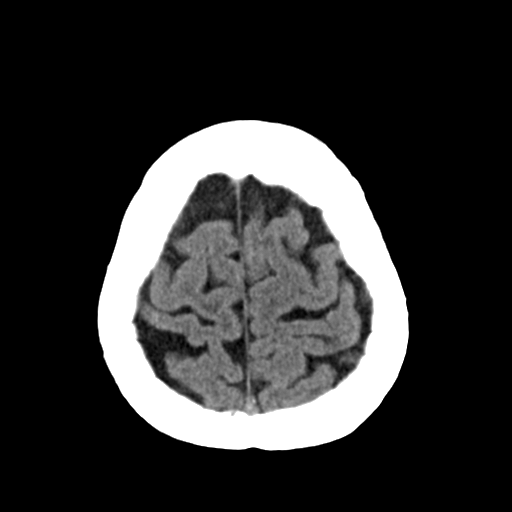
[im 29/33  brain]
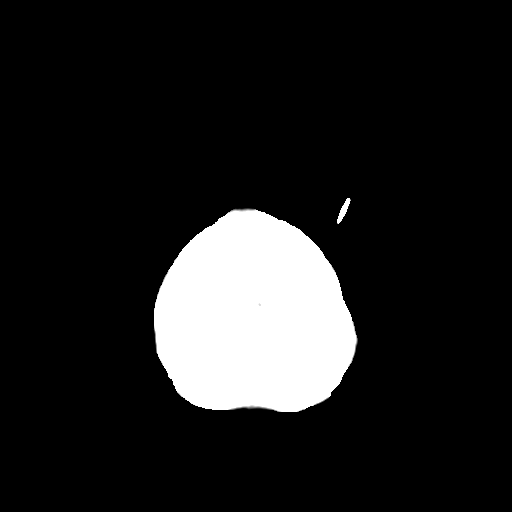

[Series 5: coronal soft tissue · coronal · 0.30mm/px · 3 of 68 slices shown]
[im 23/68  brain]
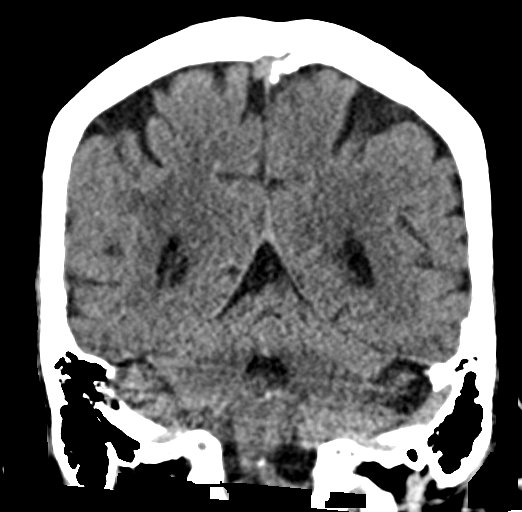
[im 30/68  brain]
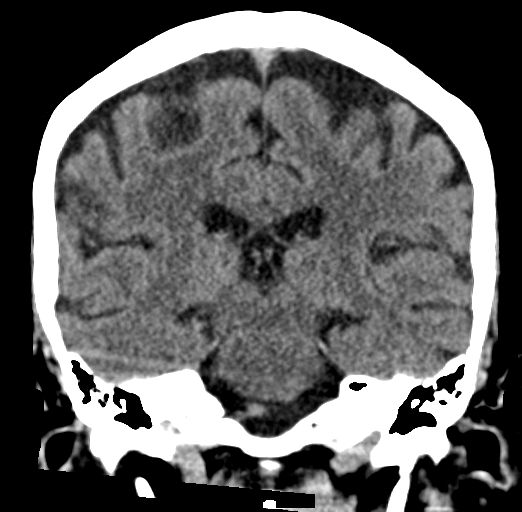
[im 38/68  brain]
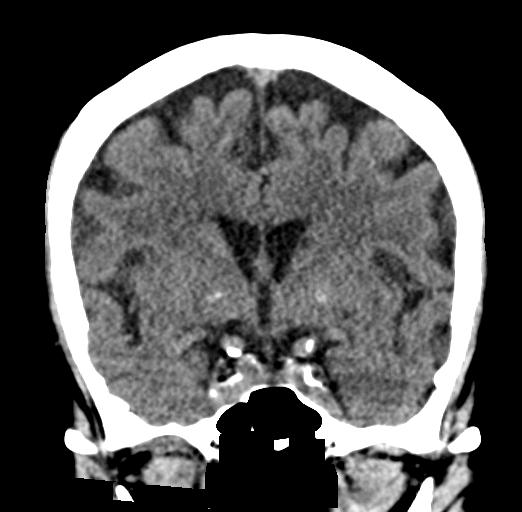

[Series 6: sagittal soft tissue · sagittal · 0.30mm/px · 3 of 53 slices shown]
[im 18/53  brain]
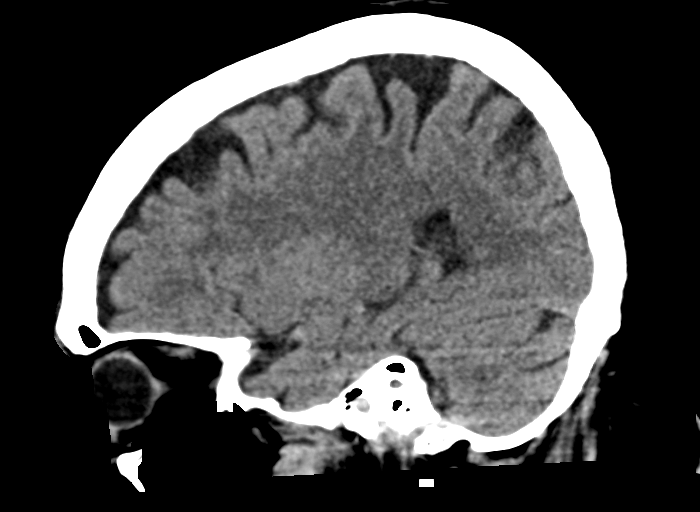
[im 27/53  brain]
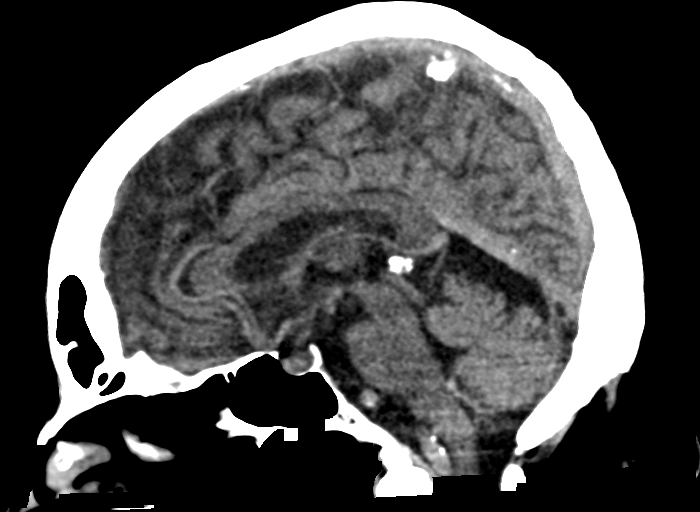
[im 35/53  brain]
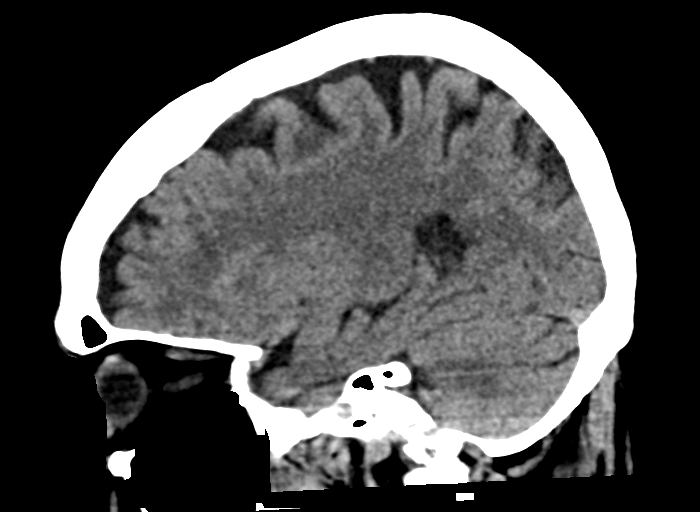

[16 of 47 positions shown; findings below may reference images not displayed]

FINDINGS: Brain: There is no mass, hemorrhage or extra-axial collection. The
size and configuration of the ventricles and extra-axial CSF spaces
are normal. There is hypoattenuation of the periventricular white
matter, most commonly indicating chronic ischemic microangiopathy.

Vascular: Atherosclerotic calcification of the vertebral and
internal carotid arteries at the skull base. No abnormal
hyperdensity of the major intracranial arteries or dural venous
sinuses.

Skull: The visualized skull base, calvarium and extracranial soft
tissues are normal.

Sinuses/Orbits: No fluid levels or advanced mucosal thickening of
the visualized paranasal sinuses. No mastoid or middle ear effusion.
The orbits are normal.

ASPECTS (Alberta Stroke Program Early CT Score)

- Ganglionic level infarction (caudate, lentiform nuclei, internal
capsule, insula, M1-M3 cortex): 7

- Supraganglionic infarction (M4-M6 cortex): 3

Total score (0-10 with 10 being normal): 10
IMPRESSION: 1. No acute intracranial abnormality.
2. ASPECTS is 10.

These results were called by telephone at the time of interpretation
on [DATE] at [DATE] to provider NANDY , who verbally
acknowledged these results.

## 2022-06-06 IMAGING — CT CT ANGIO HEAD-NECK (W OR W/O PERF)
2 of 7 series · 8 of 33 positions shown · IV contrast (APPLIED)
Comparison: Head CT [DATE]

CLINICAL DATA: Acute neurologic deficit

EXAM:
CT ANGIOGRAPHY HEAD AND NECK
TECHNIQUE: Multidetector CT imaging of the head and neck was performed using
the standard protocol during bolus administration of intravenous
contrast. Multiplanar CT image reconstructions and MIPs were
obtained to evaluate the vascular anatomy. Carotid stenosis
measurements (when applicable) are obtained utilizing NASCET
criteria, using the distal internal carotid diameter as the
denominator.

[Series 4: cta head neck · axial · 0.59mm/px · z∈[-262,-144]mm · 2 of 177 slices shown]
[im 59/177  soft-tissue]
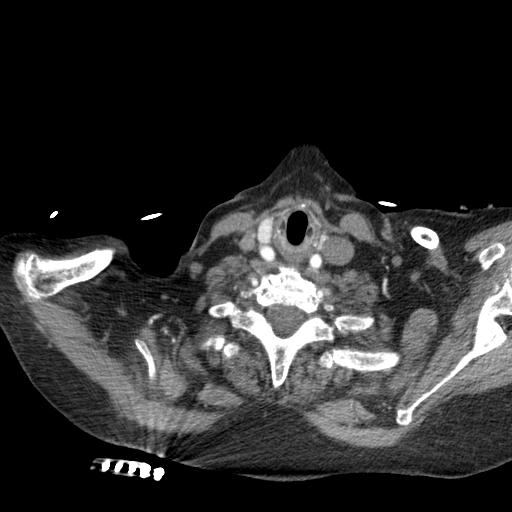
[im 118/177  soft-tissue]
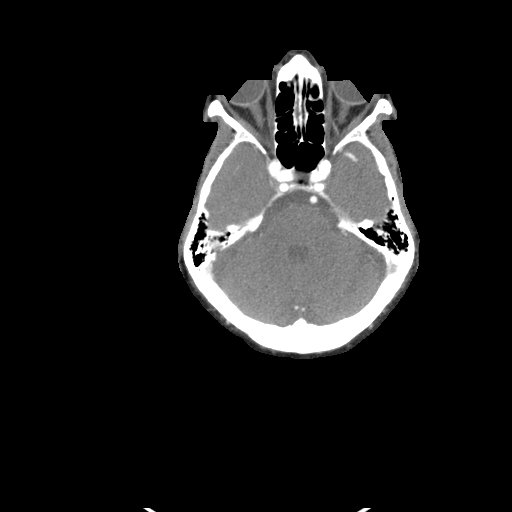

[Series 6: ax thin · axial · 0.37mm/px · z∈[-324,-77]mm · 6 of 347 slices shown]
[im 50/347  soft-tissue]
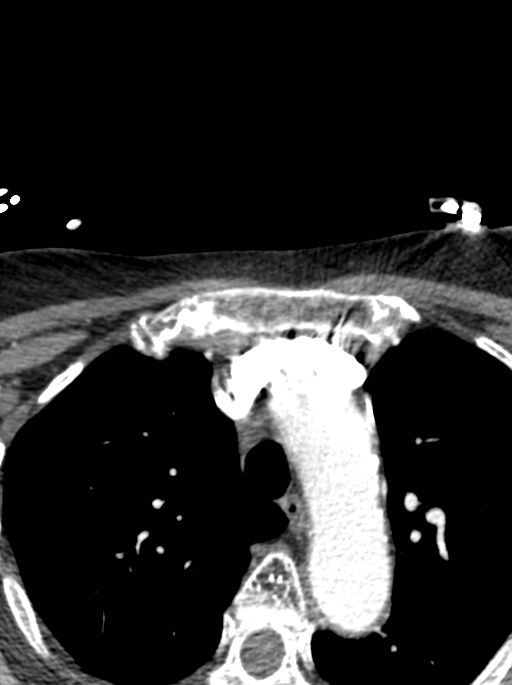
[im 99/347  bone]
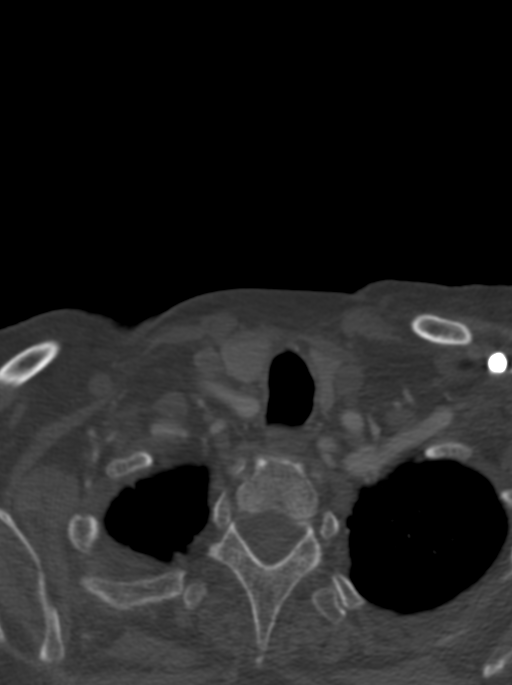
[im 149/347  soft-tissue]
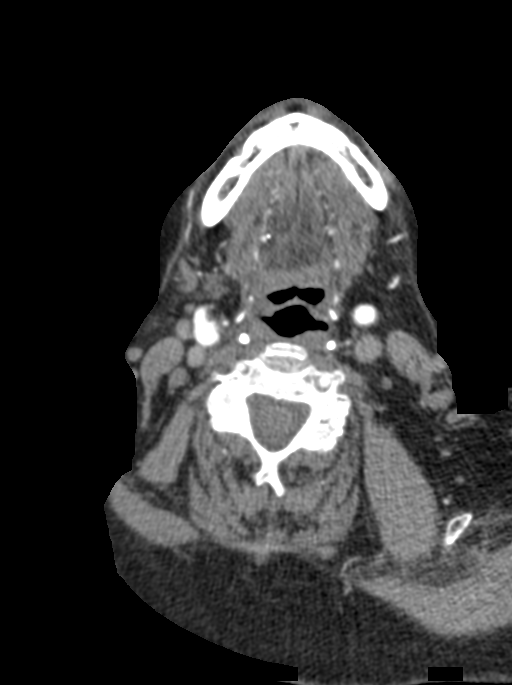
[im 198/347  bone]
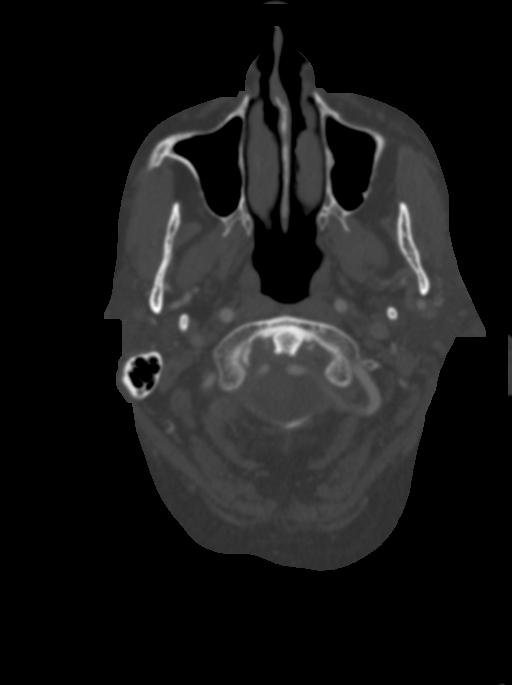
[im 248/347  soft-tissue]
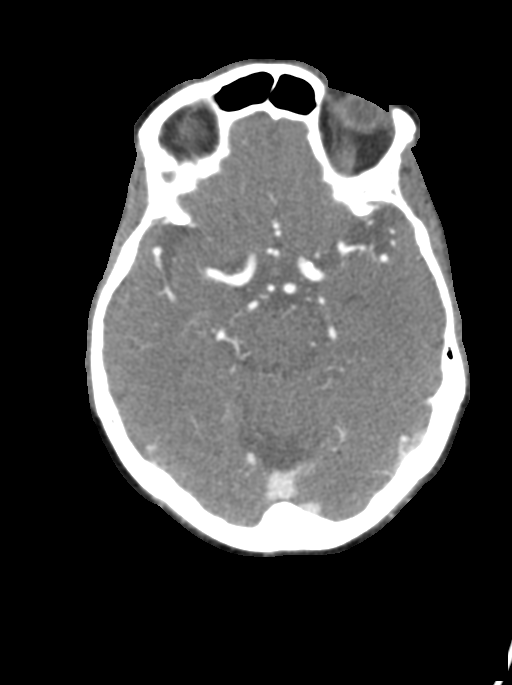
[im 297/347  bone]
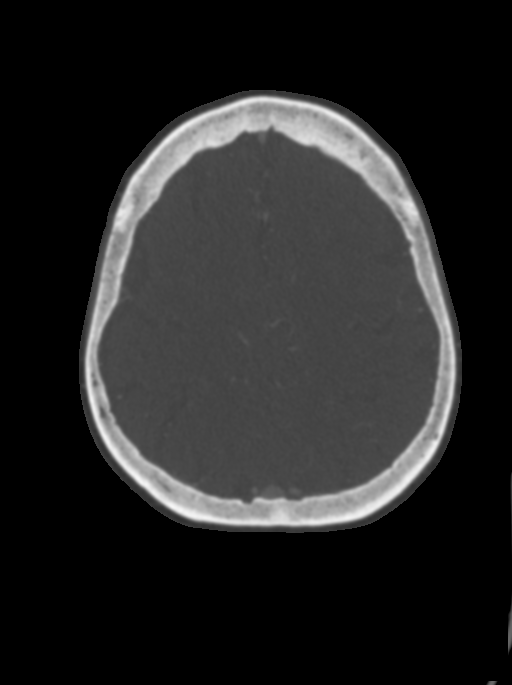

[8 of 33 positions shown; findings below may reference images not displayed]

RADIATION DOSE REDUCTION: This exam was performed according to the
departmental dose-optimization program which includes automated
exposure control, adjustment of the mA and/or kV according to
patient size and/or use of iterative reconstruction technique.

CONTRAST:  75mL OMNIPAQUE IOHEXOL 350 MG/ML SOLN
FINDINGS: CTA NECK FINDINGS

SKELETON: There is no bony spinal canal stenosis. No lytic or
blastic lesion.

OTHER NECK: Normal pharynx, larynx and major salivary glands. No
cervical lymphadenopathy. Unremarkable thyroid gland.

UPPER CHEST: No pneumothorax or pleural effusion. No nodules or
masses.

AORTIC ARCH:

There is calcific atherosclerosis of the aortic arch. There is no
aneurysm, dissection or hemodynamically significant stenosis of the
visualized portion of the aorta. Conventional 3 vessel aortic
branching pattern. The visualized proximal subclavian arteries are
widely patent.

RIGHT CAROTID SYSTEM: Normal without aneurysm, dissection or
stenosis.

LEFT CAROTID SYSTEM: Normal without aneurysm, dissection or
stenosis.

VERTEBRAL ARTERIES: Left dominant configuration. Both origins are
clearly patent. There is no dissection, occlusion or flow-limiting
stenosis to the skull base (V1-V3 segments).

CTA HEAD FINDINGS

POSTERIOR CIRCULATION:

--Vertebral arteries: Normal V4 segments.

--Inferior cerebellar arteries: Normal.

--Basilar artery: Normal.

--Superior cerebellar arteries: Normal.

--Posterior cerebral arteries (PCA): Normal.

ANTERIOR CIRCULATION:

--Intracranial internal carotid arteries: Atherosclerotic
calcification of the internal carotid arteries at the skull base
without hemodynamically significant stenosis.

--Anterior cerebral arteries (ACA): Normal. Both A1 segments are
present. Patent anterior communicating artery (a-comm).

--Middle cerebral arteries (MCA): Normal.

VENOUS SINUSES: As permitted by contrast timing, patent.

ANATOMIC VARIANTS: None

Review of the MIP images confirms the above findings.
IMPRESSION: 1. No emergent large vessel occlusion or high-grade stenosis of the
intracranial or cervical arteries.
2. Aortic Atherosclerosis ([7R]-[7R]).

## 2022-06-06 MED ORDER — IOHEXOL 350 MG/ML SOLN
75.0000 mL | Freq: Once | INTRAVENOUS | Status: AC | PRN
  Administered 2022-06-06: 75 mL via INTRAVENOUS

## 2022-06-06 MED ORDER — SODIUM CHLORIDE 0.9% FLUSH
3.0000 mL | Freq: Once | INTRAVENOUS | Status: AC
  Administered 2022-06-07: 3 mL via INTRAVENOUS

## 2022-06-06 NOTE — ED Notes (Signed)
Primary rn jean notified code stroke coming to room 8

## 2022-06-06 NOTE — ED Triage Notes (Addendum)
Pt states was sitting watching tv at 2100 when she began to cough and experience double vision with coughing. Pt states she became off balance, no slurred speech noted, pt states she sees two of objects at this time, pt with equal upper extremity movement, no slurred speech noted. Slight right ptosis noted. Pt is off balance when attempting to ambulate noted by this staff member

## 2022-06-06 NOTE — ED Notes (Signed)
Activated Code [employer] w/ [employer] @ [employer] 

## 2022-06-06 NOTE — ED Provider Notes (Signed)
[facility] Provider Note   Event Date/Time   First MD Initiated Contact with Patient 06/06/22 2256     (approximate) History  No chief complaint on file.  HPI Pamela Trevino is a 71 y.o. female with a stated past medical history of hypertension hypercholesterolemia who presents for acute onset double vision, vertigo, and nausea as well as an accompanying occipital headache.  Patient states that she had a sneeze at approximately 2100 tonight.  Patient also complaining of tongue numbness ROS: Patient currently denies any tinnitus, difficulty speaking, facial droop, sore throat, chest pain, shortness of breath, abdominal pain, vomiting/diarrhea, dysuria, or weakness/numbness/paresthesias in any extremity   Physical Exam  Triage Vital Signs: ED Triage Vitals [06/06/22 2210]  Enc Vitals Group     BP (!) 172/85     Pulse Rate 88     Resp 18     Temp 98.1 F (36.7 C)     Temp Source Oral     SpO2 95 %     Weight      Height      Head Circumference      Peak Flow      Pain Score      Pain Loc      Pain Edu?      Excl. in [place]?    Most recent vital signs: Vitals:   06/07/22 1734 06/07/22 2026  BP: (!) 150/65 139/60  Pulse: 71 85  Resp: (!) 21 19  Temp:  98 F (36.7 C)  SpO2: 98% 98%   General: Awake, oriented x4. CV:  Good peripheral perfusion.  Resp:  Normal effort.  Abd:  No distention.  Other:  Elderly overweight Hispanic female laying in bed with right eye closed.  Right eye deviated to the right.  No nystagmus appreciated. ED Results / Procedures / Treatments  Labs (all labs ordered are listed, but only abnormal results are displayed) Labs Reviewed  COMPREHENSIVE METABOLIC PANEL - Abnormal; Notable for the following components:      Result Value   Glucose, Bld 112 (*)    BUN 30 (*)    Creatinine, Ser 1.07 (*)    GFR, Estimated 56 (*)    All other components within normal limits  CBG MONITORING, ED - Abnormal; Notable for the following  components:   Glucose-Capillary 112 (*)    All other components within normal limits  PROTIME-INR  APTT  CBC  DIFFERENTIAL  HIV ANTIBODY (ROUTINE TESTING W REFLEX)  LIPID PANEL  HEMOGLOBIN C7E  BASIC METABOLIC PANEL  CBC  MAGNESIUM  I-STAT CREATININE, ED  RADIOLOGY ED MD interpretation: CT of the head without contrast interpreted by me shows no evidence of acute abnormalities including no intracerebral hemorrhage, obvious masses, or significant edema -Agree with radiology assessment Official radiology report(s): MR BRAIN W CONTRAST  Result Date: 06/07/2022 CLINICAL DATA:  Possible demyelination EXAM: MRI HEAD WITH CONTRAST TECHNIQUE: Multiplanar, multiecho pulse sequences of the brain and surrounding structures were obtained with intravenous contrast. CONTRAST:  40m GADAVIST GADOBUTROL 1 MMOL/ML IV SOLN COMPARISON:  MRI brain without contrast 06/07/2022 at 9:14 p.m. FINDINGS: There is no abnormal contrast enhancement. No new findings compared to the noncontrast examination. IMPRESSION: No abnormal contrast enhancement. Electronically Signed   By: KUlyses JarredM.D.   On: 06/07/2022 22:05   ECHOCARDIOGRAM COMPLETE  Result Date: 06/07/2022    ECHOCARDIOGRAM REPORT   Patient Name:   Pamela NOLASCODate of Exam: 06/07/2022 Medical Rec #:  [id number]  Height:       65.0 in Accession #:    [id number]   Weight:       270.0 lb Date of Birth:  1951-09-08    BSA:          2.247 m Patient Age:    26 years     BP:           146/75 mmHg Patient Gender: F            HR:           69 bpm. Exam Location:  [facility] Procedure: 2D Echo and Intracardiac Opacification Agent Indications:     Stroke I63.9  History:         Patient has no prior history of Echocardiogram examinations.  Sonographer:     Kathlen Brunswick RDCS Referring Phys:  [id number] Athena Masse Diagnosing Phys: Neoma Laming  Sonographer Comments: Technically difficult study due to poor echo windows and patient is morbidly obese. Image acquisition  challenging due to patient body habitus. IMPRESSIONS  1. Left ventricular ejection fraction, by estimation, is 55 to 60%. The left ventricle has normal function. The left ventricle has no regional wall motion abnormalities. There is mild concentric left ventricular hypertrophy. Left ventricular diastolic parameters are consistent with Grade I diastolic dysfunction (impaired relaxation).  2. Right ventricular systolic function is mildly reduced. The right ventricular size is moderately enlarged.  3. Left atrial size was moderately dilated.  4. Right atrial size was moderately dilated.  5. The mitral valve is normal in structure. Trivial mitral valve regurgitation. No evidence of mitral stenosis.  6. The aortic valve is normal in structure. Aortic valve regurgitation is mild. Aortic valve sclerosis is present, with no evidence of aortic valve stenosis.  7. The inferior vena cava is normal in size with greater than 50% respiratory variability, suggesting right atrial pressure of 3 mmHg. FINDINGS  Left Ventricle: Left ventricular ejection fraction, by estimation, is 55 to 60%. The left ventricle has normal function. The left ventricle has no regional wall motion abnormalities. Definity contrast agent was given IV to delineate the left ventricular  endocardial borders. The left ventricular internal cavity size was normal in size. There is mild concentric left ventricular hypertrophy. Left ventricular diastolic parameters are consistent with Grade I diastolic dysfunction (impaired relaxation). Right Ventricle: The right ventricular size is moderately enlarged. No increase in right ventricular wall thickness. Right ventricular systolic function is mildly reduced. Left Atrium: Left atrial size was moderately dilated. Right Atrium: Right atrial size was moderately dilated. Pericardium: There is no evidence of pericardial effusion. Mitral Valve: The mitral valve is normal in structure. Trivial mitral valve regurgitation. No  evidence of mitral valve stenosis. Tricuspid Valve: The tricuspid valve is normal in structure. Tricuspid valve regurgitation is mild . No evidence of tricuspid stenosis. Aortic Valve: The aortic valve is normal in structure. Aortic valve regurgitation is mild. Aortic valve sclerosis is present, with no evidence of aortic valve stenosis. Aortic valve peak gradient measures 4.2 mmHg. Pulmonic Valve: The pulmonic valve was normal in structure. Pulmonic valve regurgitation is trivial. No evidence of pulmonic stenosis. Aorta: The aortic root is normal in size and structure. Venous: The inferior vena cava is normal in size with greater than 50% respiratory variability, suggesting right atrial pressure of 3 mmHg. IAS/Shunts: No atrial level shunt detected by color flow Doppler.  LEFT VENTRICLE PLAX 2D LVIDd:         4.12 cm  Diastology LVIDs:         2.69 cm      LV e' medial:    10.70 cm/s LV PW:         1.02 cm      LV E/e' medial:  4.2 LV IVS:        1.08 cm      LV e' lateral:   9.03 cm/s LVOT diam:     2.00 cm      LV E/e' lateral: 5.0 LV SV:         77 LV SV Index:   34 LVOT Area:     3.14 cm  LV Volumes (MOD) LV vol d, MOD A2C: 98.8 ml LV vol d, MOD A4C: 100.0 ml LV vol s, MOD A2C: 34.6 ml LV vol s, MOD A4C: 37.6 ml LV SV MOD A2C:     64.2 ml LV SV MOD A4C:     100.0 ml LV SV MOD BP:      64.3 ml LEFT ATRIUM           Index LA diam:      2.90 cm 1.29 cm/m LA Vol (A4C): 18.2 ml 8.10 ml/m  AORTIC VALVE                 PULMONIC VALVE AV Area (Vmax): 3.26 cm     PV Vmax:       1.14 m/s AV Vmax:        103.00 cm/s  PV Peak grad:  5.2 mmHg AV Peak Grad:   4.2 mmHg LVOT Vmax:      107.00 cm/s LVOT Vmean:     70.500 cm/s LVOT VTI:       0.246 m  AORTA Ao Root diam: 3.20 cm Ao Asc diam:  2.90 cm MITRAL VALVE MV Area (PHT): 2.88 cm    SHUNTS MV Decel Time: 263 msec    Systemic VTI:  0.25 m MV E velocity: 45.00 cm/s  Systemic Diam: 2.00 cm MV A velocity: 52.30 cm/s MV E/A ratio:  0.86 Shaukat Khan Electronically  signed by Neoma Laming Signature Date/Time: 06/07/2022/5:11:02 PM    Final    MR BRAIN WO CONTRAST  Result Date: 06/07/2022 CLINICAL DATA:  Double vision EXAM: MRI HEAD WITHOUT CONTRAST TECHNIQUE: Multiplanar, multiecho pulse sequences of the brain and surrounding structures were obtained without intravenous contrast. COMPARISON:  None Available. FINDINGS: Brain: No acute infarct, mass effect or extra-axial collection. No acute or chronic hemorrhage. There is multifocal hyperintense T2-weighted signal within the white matter. Parenchymal volume and CSF spaces are normal. The midline structures are normal. Vascular: Major flow voids are preserved. Skull and upper cervical spine: Normal calvarium and skull base. Visualized upper cervical spine and soft tissues are normal. Sinuses/Orbits:No paranasal sinus fluid levels or advanced mucosal thickening. No mastoid or middle ear effusion. Normal orbits. IMPRESSION: 1. No acute intracranial abnormality. 2. Findings of chronic small vessel ischemia. Electronically Signed   By: Ulyses Jarred M.D.   On: 06/07/2022 02:06   PROCEDURES: Critical Care performed: Yes, see critical care procedure note(s) .1-3 Lead EKG Interpretation  Performed by: Naaman Plummer, MD Authorized by: Naaman Plummer, MD     Interpretation: normal     ECG rate:  84   ECG rate assessment: normal     Rhythm: sinus rhythm     Ectopy: none     Conduction: normal    MEDICATIONS ORDERED IN ED: Medications  aspirin EC tablet 81 mg (  81 mg Oral Given 06/07/22 0813)  atorvastatin (LIPITOR) tablet 40 mg (40 mg Oral Given 06/07/22 5638)   stroke: early stages of recovery book (has no administration in time range)  acetaminophen (TYLENOL) tablet 650 mg (650 mg Oral Given 06/07/22 0829)    Or  acetaminophen (TYLENOL) 160 MG/5ML solution 650 mg ( Per Tube See Alternative 06/07/22 0829)    Or  acetaminophen (TYLENOL) suppository 650 mg ( Rectal See Alternative 06/07/22 0829)  enoxaparin (LOVENOX)  injection 62.5 mg (62.5 mg Subcutaneous Given 06/07/22 0814)  pneumococcal 20-valent conjugate vaccine (PREVNAR 20) injection 0.5 mL (has no administration in time range)  sodium chloride flush (NS) 0.9 % injection 3 mL (3 mLs Intravenous Given 06/07/22 0018)  iohexol (OMNIPAQUE) 350 MG/ML injection 75 mL (75 mLs Intravenous Contrast Given 06/06/22 2312)  gadobutrol (GADAVIST) 1 MMOL/ML injection 10 mL (10 mLs Intravenous Contrast Given 06/07/22 2109)   IMPRESSION / MDM / ASSESSMENT AND PLAN / ED COURSE  I reviewed the triage vital signs and the nursing notes.                             Differential diagnosis includes, but is not limited to, CVA, demyelinating disease, space-occupying intracranial lesion, carotid dissection The patient is on the cardiac monitor to evaluate for evidence of arrhythmia and/or significant heart rate changes. Patient's presentation is most consistent with acute presentation with potential threat to life or bodily function. Stroke alert PMH risk factors: Hypertension, hypercholesterolemia Neurologic Deficits: Diplopia, vertigo Last known Well Time: 2100 NIH Stroke Score: 2 Given History and Exam I have lower suspicion for infectious etiology, neurologic changes secondary to toxicologic ingestion, seizure, complex migraine. Presentation concerning for possible stroke requiring workup.  Workup: Labs: POC glucose, CBC, BMP, LFTs, Troponin, PT/INR, PTT, Type and Screen Other Diagnostics: ECG, CXR, non-contrast head CT followed by CTA brain and neck (to r/o large vessel occlusion amenable to thrombectomy) Interventions: Patient low on NIH scale and out of the window for tpa.  Consult: Neurology. Discussed regarding patients neurological symptoms and last well-known time and eligibility for TPA criteria. Care of this patient will be signed out to the oncoming physician at the end of my shift.  All pertinent patient information conveyed and all questions answered.  All  further care and disposition decisions will be made by the oncoming physician.   FINAL CLINICAL IMPRESSION(S) / ED DIAGNOSES   Final diagnoses:  Diplopia  Nausea  Vertigo   Rx / DC Orders   ED Discharge Orders     None      Note:  This document was prepared using Dragon voice recognition software and may include unintentional dictation errors.   Naaman Plummer, MD 06/07/22 [phone]

## 2022-06-06 NOTE — ED Notes (Signed)
Pt to ct with this rn.

## 2022-06-07 ENCOUNTER — Inpatient Hospital Stay
Admit: 2022-06-07 | Discharge: 2022-06-07 | Disposition: A | Discharge status: Home / Self Care | Payer: Medicare Other | Attending: Internal Medicine | Admitting: Internal Medicine

## 2022-06-07 ENCOUNTER — Inpatient Hospital Stay: Payer: Medicare Other

## 2022-06-07 ENCOUNTER — Encounter: Payer: Self-pay | Admitting: Radiology

## 2022-06-07 ENCOUNTER — Other Ambulatory Visit: Payer: Self-pay

## 2022-06-07 DIAGNOSIS — E7800 Pure hypercholesterolemia, unspecified: Secondary | ICD-10-CM | POA: Diagnosis present

## 2022-06-07 DIAGNOSIS — Z8543 Personal history of malignant neoplasm of ovary: Secondary | ICD-10-CM | POA: Diagnosis not present

## 2022-06-07 DIAGNOSIS — Z923 Personal history of irradiation: Secondary | ICD-10-CM | POA: Diagnosis not present

## 2022-06-07 DIAGNOSIS — I10 Essential (primary) hypertension: Secondary | ICD-10-CM

## 2022-06-07 DIAGNOSIS — R29818 Other symptoms and signs involving the nervous system: Secondary | ICD-10-CM

## 2022-06-07 DIAGNOSIS — H5121 Internuclear ophthalmoplegia, right eye: Secondary | ICD-10-CM | POA: Diagnosis present

## 2022-06-07 DIAGNOSIS — Z7982 Long term (current) use of aspirin: Secondary | ICD-10-CM | POA: Diagnosis not present

## 2022-06-07 DIAGNOSIS — Z79899 Other long term (current) drug therapy: Secondary | ICD-10-CM | POA: Diagnosis not present

## 2022-06-07 DIAGNOSIS — Z79811 Long term (current) use of aromatase inhibitors: Secondary | ICD-10-CM | POA: Diagnosis not present

## 2022-06-07 DIAGNOSIS — H532 Diplopia: Secondary | ICD-10-CM | POA: Diagnosis present

## 2022-06-07 DIAGNOSIS — I129 Hypertensive chronic kidney disease with stage 1 through stage 4 chronic kidney disease, or unspecified chronic kidney disease: Secondary | ICD-10-CM | POA: Diagnosis present

## 2022-06-07 DIAGNOSIS — Z9221 Personal history of antineoplastic chemotherapy: Secondary | ICD-10-CM | POA: Diagnosis not present

## 2022-06-07 DIAGNOSIS — Z853 Personal history of malignant neoplasm of breast: Secondary | ICD-10-CM

## 2022-06-07 DIAGNOSIS — Z885 Allergy status to narcotic agent status: Secondary | ICD-10-CM | POA: Diagnosis not present

## 2022-06-07 DIAGNOSIS — N1831 Chronic kidney disease, stage 3a: Secondary | ICD-10-CM | POA: Diagnosis present

## 2022-06-07 LAB — ECHOCARDIOGRAM COMPLETE
AR max vel: 3.26 cm2
AV Peak grad: 4.2 mmHg
Ao pk vel: 1.03 m/s
Area-P 1/2: 2.88 cm2
Calc EF: 64 %
Height: 65 in
S' Lateral: 2.69 cm
Single Plane A2C EF: 65 %
Single Plane A4C EF: 62.4 %
Weight: 4320 oz

## 2022-06-07 LAB — LIPID PANEL
Cholesterol: 145 mg/dL (ref 0–200)
HDL: 52 mg/dL (ref >40)
LDL Cholesterol: 77 mg/dL (ref 0–99)
Total CHOL/HDL Ratio: 2.8 RATIO
Triglycerides: 80 mg/dL (ref <150)
VLDL: 16 mg/dL (ref 0–40)

## 2022-06-07 LAB — HIV ANTIBODY (ROUTINE TESTING W REFLEX): HIV Screen 4th Generation wRfx: NONREACTIVE

## 2022-06-07 LAB — HEMOGLOBIN A1C
Hgb A1c MFr Bld: 5.5 % (ref 4.8–5.6)
Mean Plasma Glucose: 111.15 mg/dL

## 2022-06-07 IMAGING — MR MR HEAD W/ CM
4 series · 48 of 48 positions shown · IV contrast (10ml Gadavist)
Comparison: MRI brain without contrast [DATE] at [DATE] p.m.

CLINICAL DATA: Possible demyelination

EXAM:
MRI HEAD WITH CONTRAST
TECHNIQUE: Multiplanar, multiecho pulse sequences of the brain and surrounding
structures were obtained with intravenous contrast.
CONTRAST:  10mL GADAVIST GADOBUTROL 1 MMOL/ML IV SOLN

[Series 5: T1 post-contrast · axial · 1.0mm · 0.98mm/px · z∈[-138,+19]mm · 34 of 160 slices shown (1 of 3)]
[im 1/160]
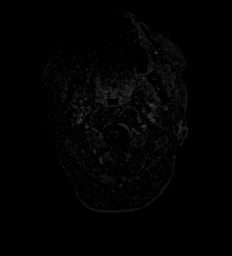
[im 5/160]
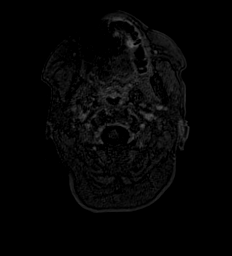
[im 10/160]
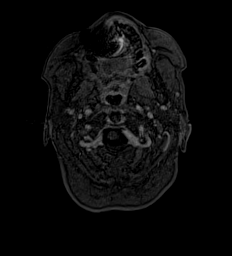
[im 15/160]
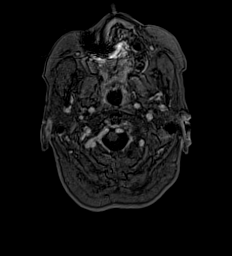
[im 20/160]
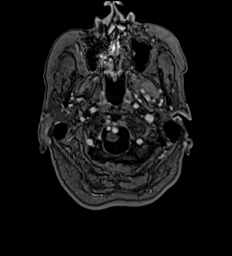
[im 25/160]
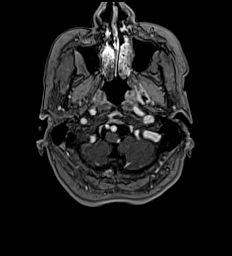
[im 29/160]
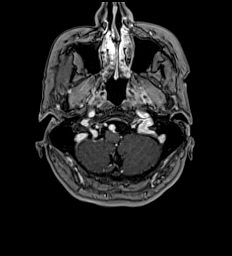
[im 34/160]
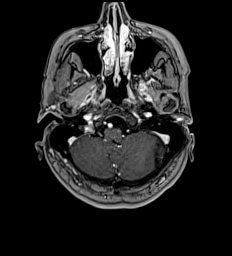
[im 39/160]
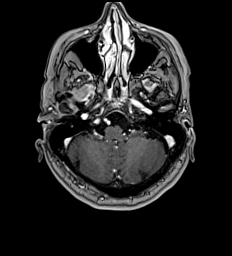
[im 44/160]
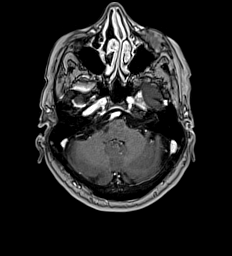
[im 49/160]
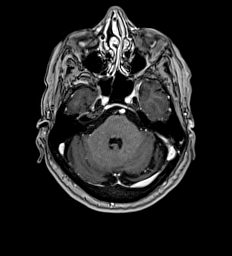
[im 54/160]
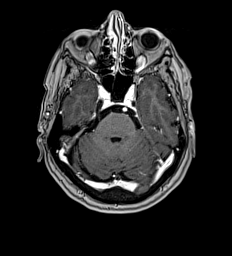
[im 58/160]
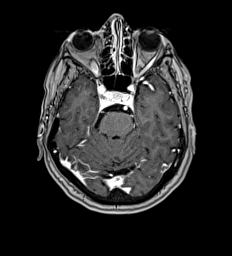
[im 63/160]
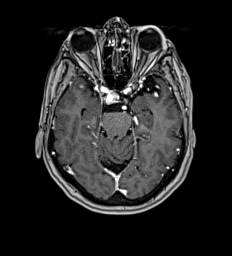
[im 68/160]
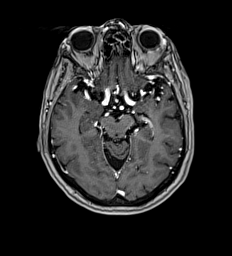
[im 73/160]
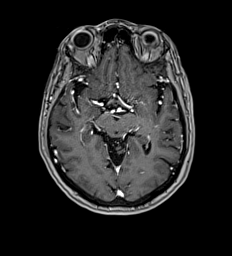
[im 78/160]
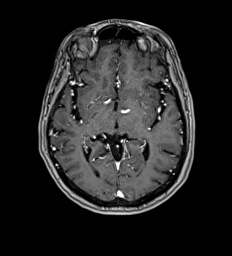
[im 82/160]
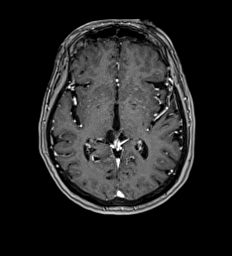
[im 87/160]
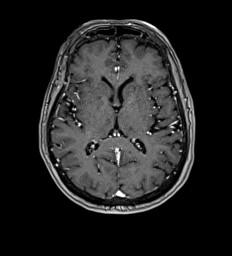
[im 92/160]
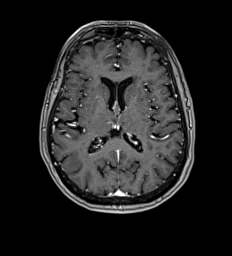
[im 97/160]
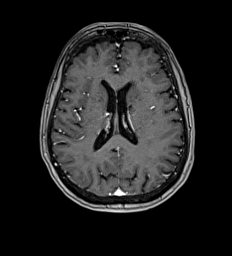
[im 102/160]
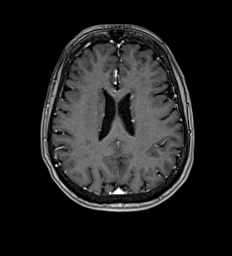
[im 107/160]
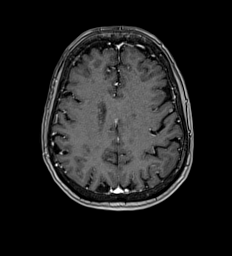
[im 111/160]
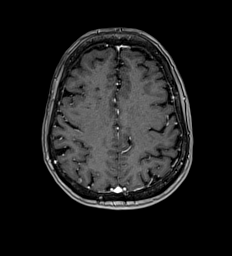
[im 116/160]
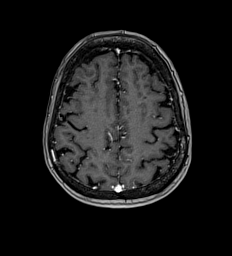
[im 121/160]
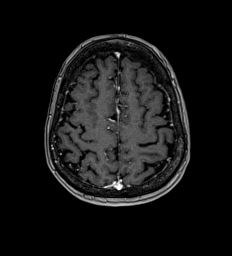
[im 126/160]
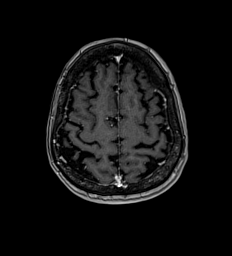
[im 131/160]
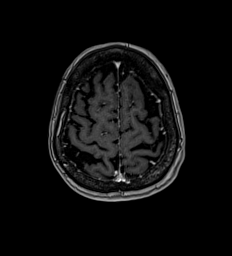
[im 135/160]
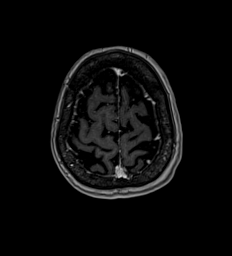
[im 140/160]
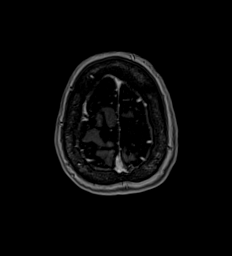
[im 145/160]
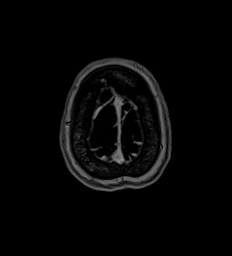
[im 150/160]
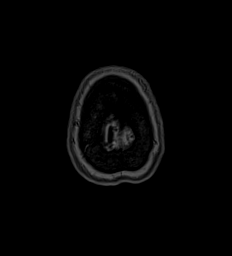
[im 155/160]
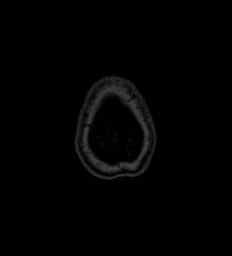
[im 160/160]
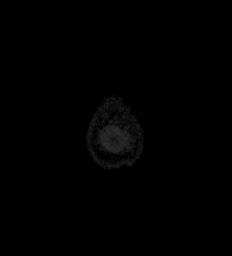

[Series 6: T1 post-contrast · coronal · 5.0mm · 0.57mm/px · 5 of 26 slices shown (2 of 3)]
[im 1/26]
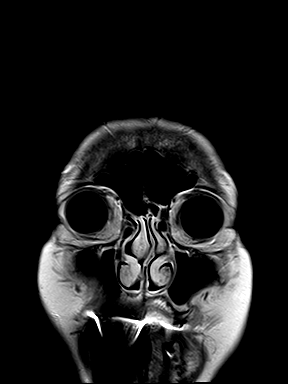
[im 7/26]
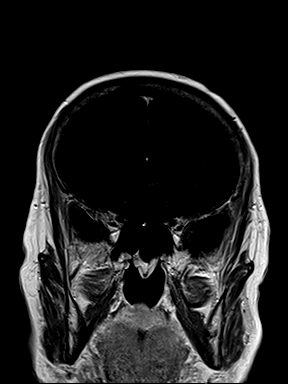
[im 13/26]
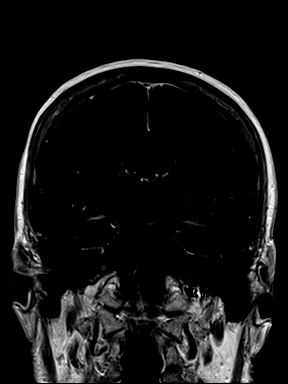
[im 19/26]
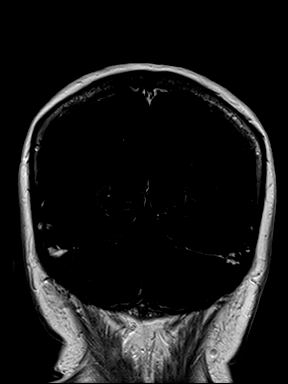
[im 26/26]
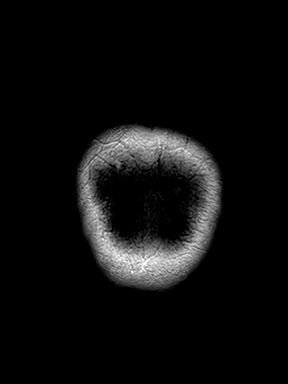

[Series 7: T2 post-contrast · coronal · 5.0mm · 0.57mm/px · 5 of 26 slices shown]
[im 1/26]
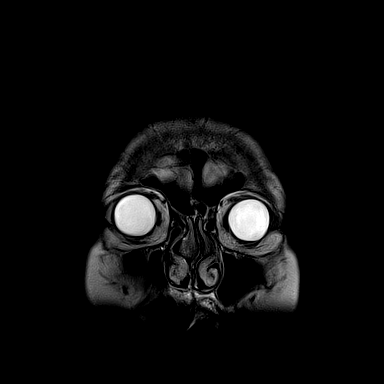
[im 7/26]
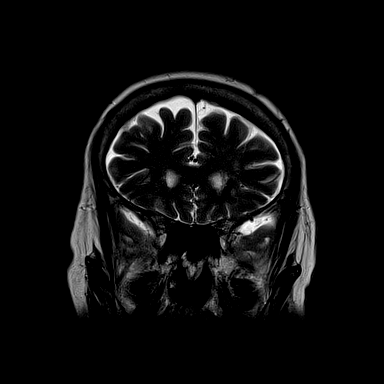
[im 13/26]
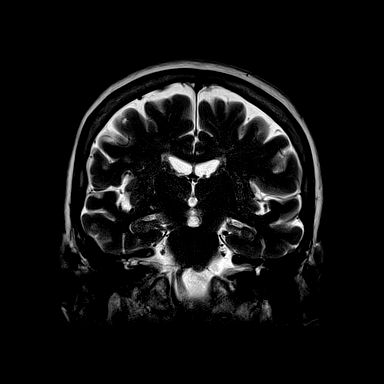
[im 19/26]
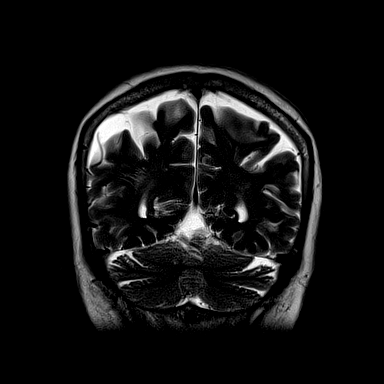
[im 26/26]
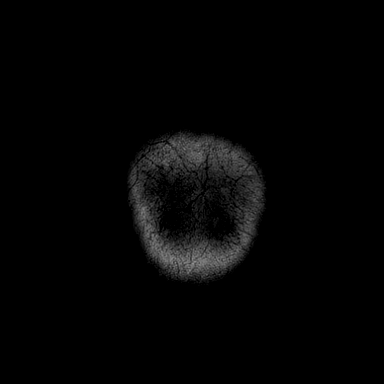

[Series 8: T1 post-contrast · sagittal · 5.0mm · 0.62mm/px · 4 of 20 slices shown (3 of 3)]
[im 1/20]
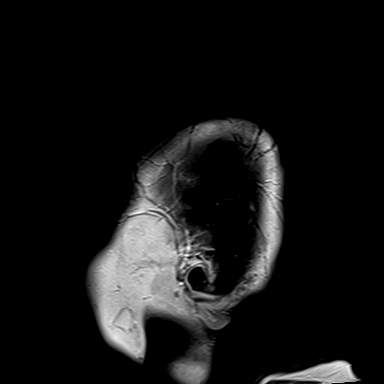
[im 7/20]
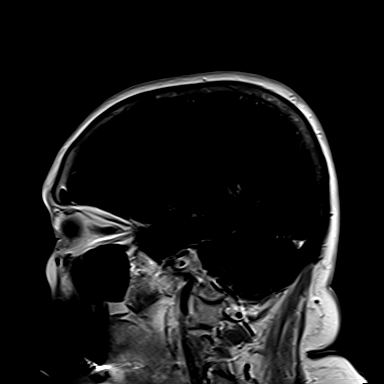
[im 13/20]
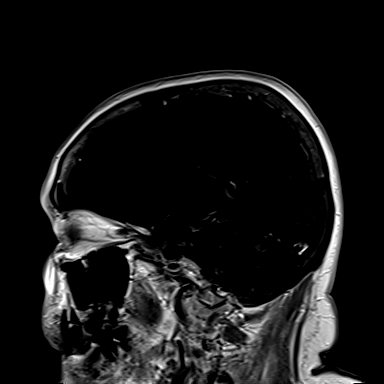
[im 20/20]
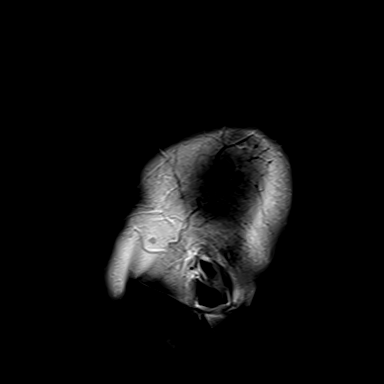

[48 of 48 positions shown; findings below may reference images not displayed]

FINDINGS: There is no abnormal contrast enhancement. No new findings compared
to the noncontrast examination.
IMPRESSION: No abnormal contrast enhancement.

## 2022-06-07 IMAGING — MR MR HEAD W/O CM
12 series · 48 of 48 positions shown · non-contrast
Comparison: None Available.

CLINICAL DATA: Double vision

EXAM:
MRI HEAD WITHOUT CONTRAST
TECHNIQUE: Multiplanar, multiecho pulse sequences of the brain and surrounding
structures were obtained without intravenous contrast.

[Series 5: T1 · sagittal · 5.0mm · 0.62mm/px · 1 of 20 slices shown (1 of 2)]
[im 1/20]
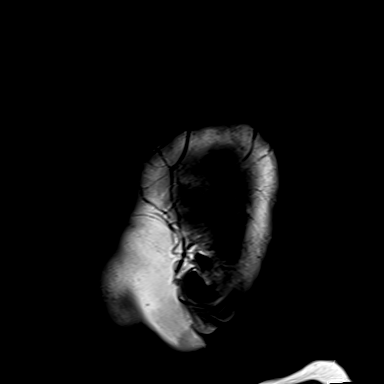

[Series 6: T2 · coronal · 5.0mm · 0.45mm/px · 2 of 30 slices shown (1 of 2)]
[im 1/30]
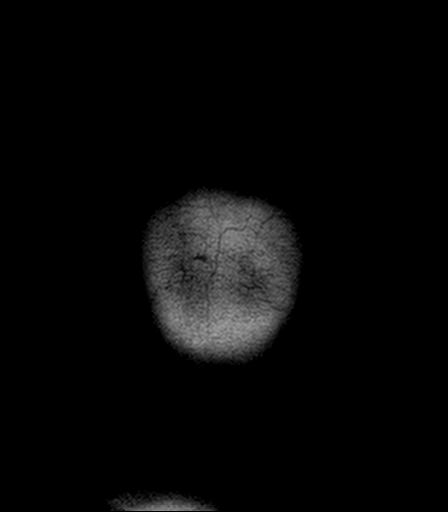
[im 30/30]
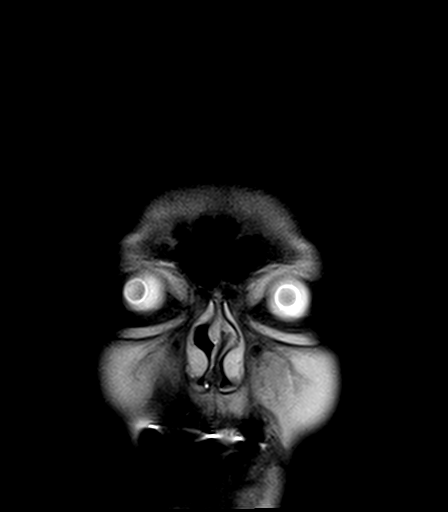

[Series 7: cor dwi_tracew · coronal · 5.0mm · 0.60mm/px · 2 of 28 slices shown]
[im 1/28]
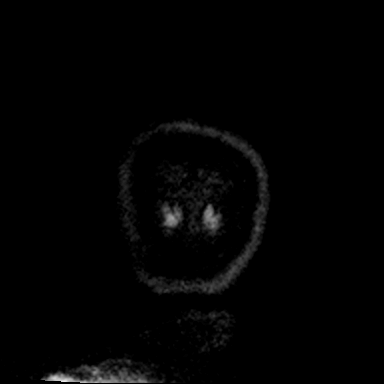
[im 28/28]
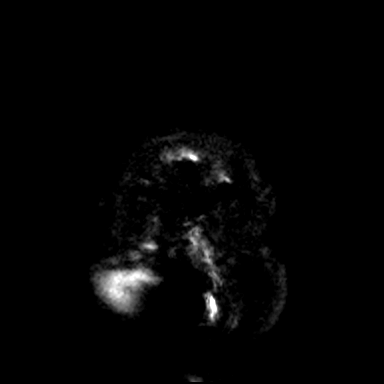

[Series 8: cor dwi_adc · coronal · 5.0mm · 0.60mm/px · 2 of 26 slices shown]
[im 1/26]
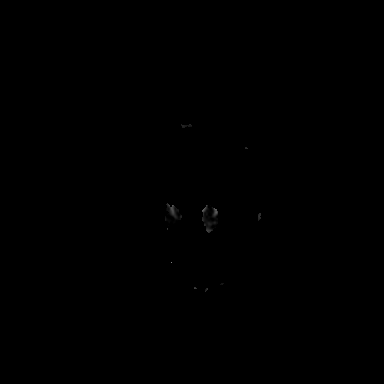
[im 26/26]
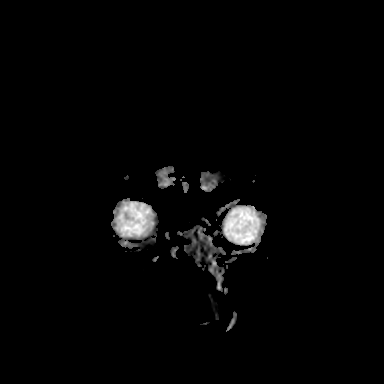

[Series 9: ax dwi_tracew · axial · 3.0mm · 0.65mm/px · z∈[-109,+31]mm · 3 of 44 slices shown]
[im 1/44]
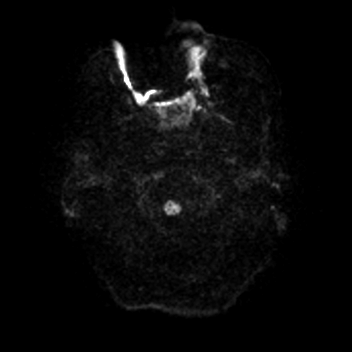
[im 22/44]
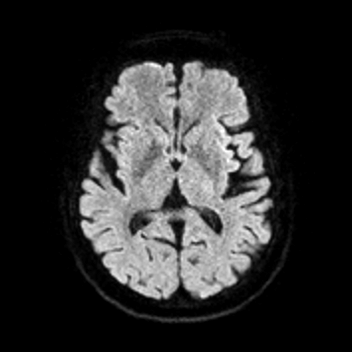
[im 44/44]
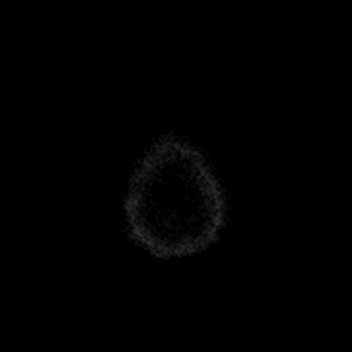

[Series 10: ax dwi_adc · axial · 3.0mm · 0.65mm/px · z∈[-109,+31]mm · 3 of 44 slices shown]
[im 1/44]
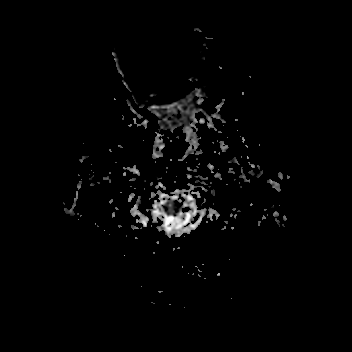
[im 22/44]
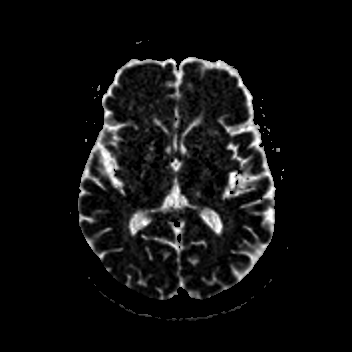
[im 44/44]
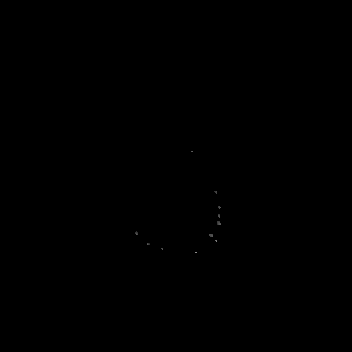

[Series 11: T2 · axial · 5.0mm · 0.53mm/px · z∈[-106,+30]mm · 2 of 24 slices shown (2 of 2)]
[im 1/24]
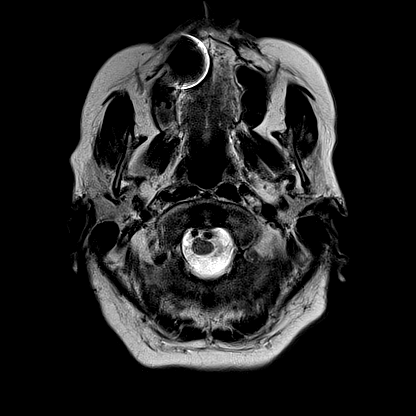
[im 24/24]
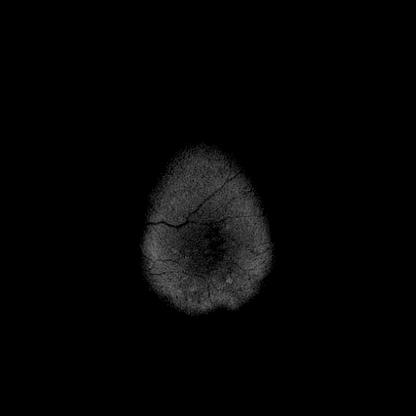

[Series 12: ax swi_mag · axial · 2.0mm · 0.90mm/px · z∈[-116,+40]mm · 6 of 80 slices shown]
[im 1/80]
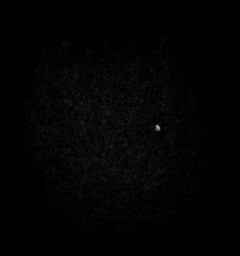
[im 16/80]
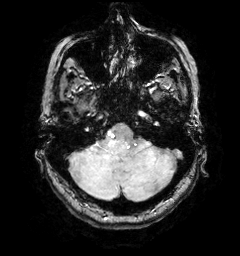
[im 32/80]
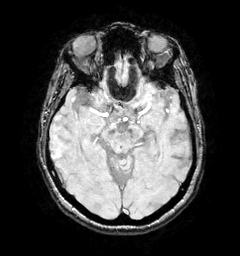
[im 48/80]
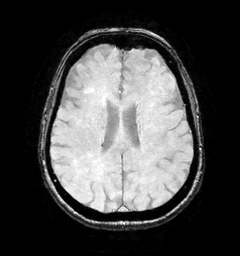
[im 64/80]
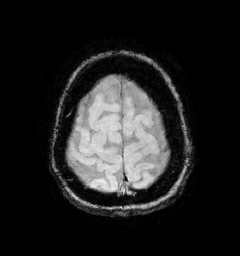
[im 80/80]
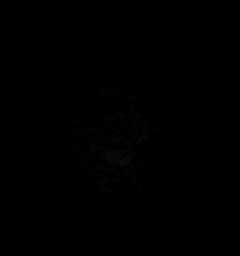

[Series 13: ax swi_pha · axial · 2.0mm · 0.90mm/px · z∈[-116,+40]mm · 6 of 80 slices shown]
[im 1/80]
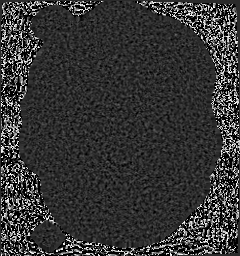
[im 16/80]
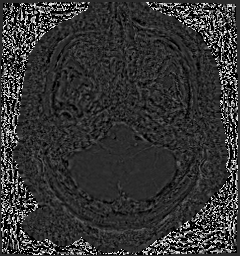
[im 32/80]
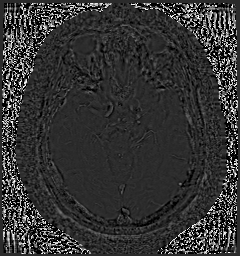
[im 48/80]
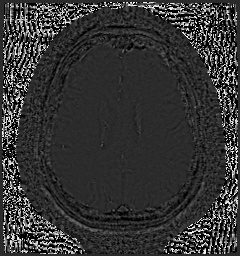
[im 64/80]
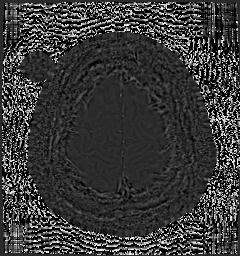
[im 80/80]
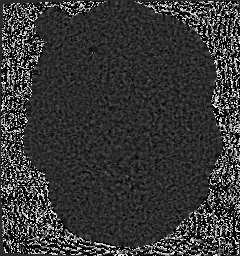

[Series 14: ax swi_swi · axial · 2.0mm · 0.90mm/px · z∈[-116,+40]mm · 6 of 80 slices shown]
[im 1/80]
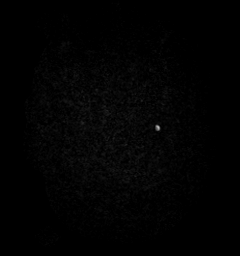
[im 16/80]
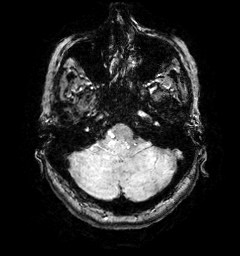
[im 32/80]
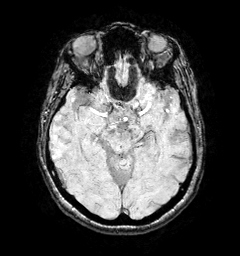
[im 48/80]
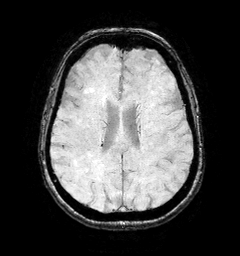
[im 64/80]
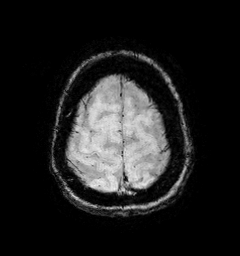
[im 80/80]
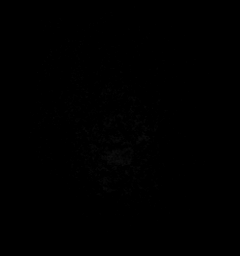

[Series 16: FLAIR · axial · 3.0mm · 0.53mm/px · z∈[-108,+32]mm · 4 of 48 slices shown]
[im 1/48]
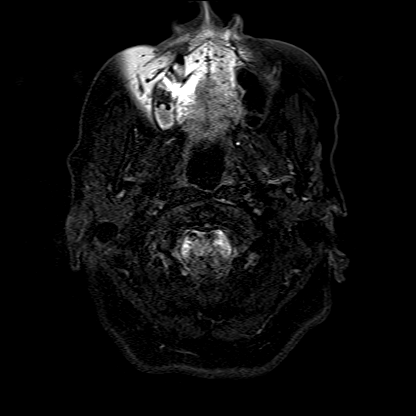
[im 16/48]
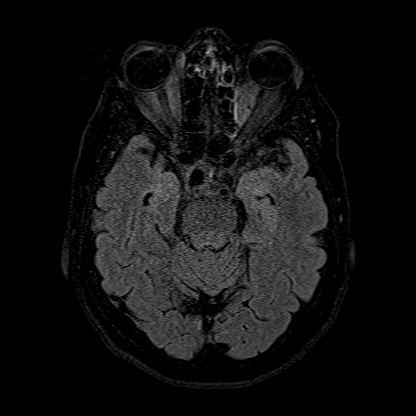
[im 32/48]
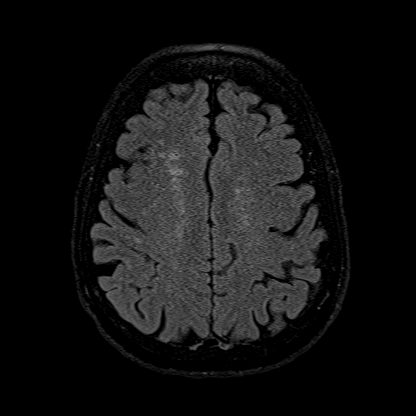
[im 48/48]
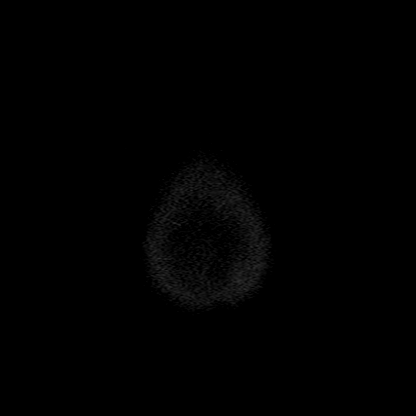

[Series 17: T1 · axial · 1.0mm · 0.98mm/px · z∈[-109,+33]mm · 11 of 144 slices shown (2 of 2)]
[im 1/144]
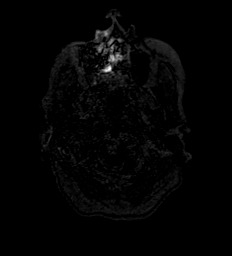
[im 15/144]
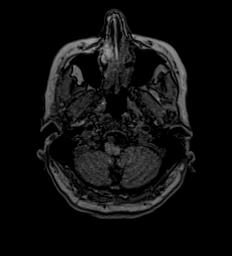
[im 29/144]
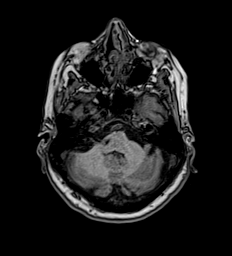
[im 43/144]
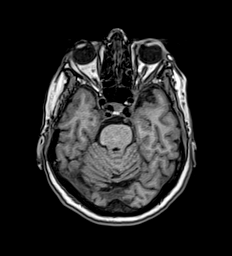
[im 58/144]
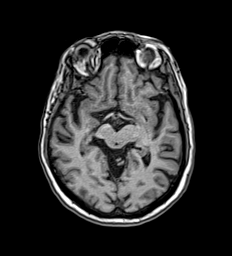
[im 72/144]
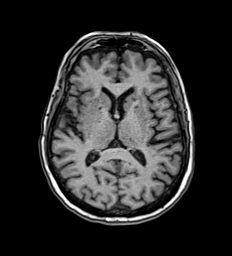
[im 86/144]
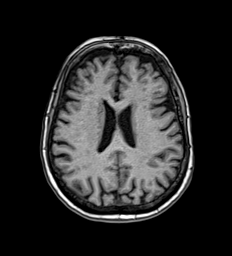
[im 101/144]
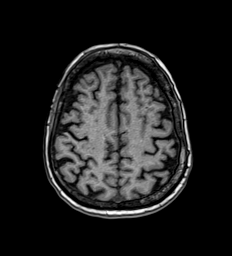
[im 115/144]
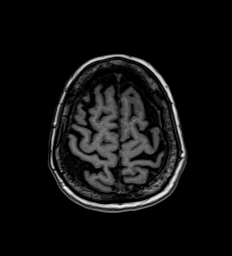
[im 129/144]
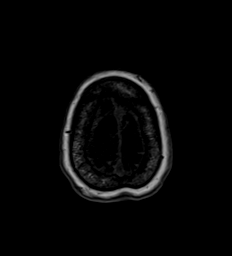
[im 144/144]
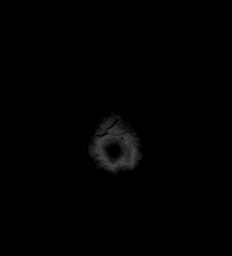

[48 of 48 positions shown; findings below may reference images not displayed]

FINDINGS: Brain: No acute infarct, mass effect or extra-axial collection. No
acute or chronic hemorrhage. There is multifocal hyperintense
T2-weighted signal within the white matter. Parenchymal volume and
CSF spaces are normal. The midline structures are normal.

Vascular: Major flow voids are preserved.

Skull and upper cervical spine: Normal calvarium and skull base.
Visualized upper cervical spine and soft tissues are normal.

Sinuses/Orbits:No paranasal sinus fluid levels or advanced mucosal
thickening. No mastoid or middle ear effusion. Normal orbits.
IMPRESSION: 1. No acute intracranial abnormality.
2. Findings of chronic small vessel ischemia.

## 2022-06-07 MED ORDER — GADOBUTROL 1 MMOL/ML IV SOLN
10.0000 mL | Freq: Once | INTRAVENOUS | Status: AC | PRN
Start: 2022-06-07 — End: 2022-06-07
  Administered 2022-06-07: 10 mL via INTRAVENOUS

## 2022-06-07 MED ORDER — PNEUMOCOCCAL 20-VAL CONJ VACC 0.5 ML IM SUSY
0.5000 mL | PREFILLED_SYRINGE | INTRAMUSCULAR | Status: DC
  Filled 2022-06-07: qty 0.5

## 2022-06-07 MED ORDER — ENOXAPARIN SODIUM 60 MG/0.6ML IJ SOSY
0.5000 mg/kg | PREFILLED_SYRINGE | INTRAMUSCULAR | Status: DC
  Administered 2022-06-07 – 2022-06-08 (×2): 62.5 mg via SUBCUTANEOUS
  Filled 2022-06-07 (×2): qty 1.2

## 2022-06-07 MED ORDER — ACETAMINOPHEN 160 MG/5ML PO SOLN: 650.0000 mg | ORAL | Status: DC | PRN

## 2022-06-07 MED ORDER — SODIUM CHLORIDE 0.9 % IV SOLN: INTRAVENOUS | Status: DC

## 2022-06-07 MED ORDER — ACETAMINOPHEN 325 MG PO TABS
650.0000 mg | ORAL_TABLET | ORAL | Status: DC | PRN
  Administered 2022-06-07 – 2022-06-08 (×2): 650 mg via ORAL
  Filled 2022-06-07 (×2): qty 2

## 2022-06-07 MED ORDER — ASPIRIN 81 MG PO TBEC
81.0000 mg | DELAYED_RELEASE_TABLET | Freq: Every day | ORAL | Status: DC
  Administered 2022-06-07 – 2022-06-08 (×2): 81 mg via ORAL
  Filled 2022-06-07 (×2): qty 1

## 2022-06-07 MED ORDER — ATORVASTATIN CALCIUM 20 MG PO TABS
40.0000 mg | ORAL_TABLET | Freq: Every day | ORAL | Status: DC
  Administered 2022-06-07 – 2022-06-08 (×2): 40 mg via ORAL
  Filled 2022-06-07 (×2): qty 2

## 2022-06-07 MED ORDER — STROKE: EARLY STAGES OF RECOVERY BOOK: Freq: Once | Status: DC

## 2022-06-07 MED ORDER — ACETAMINOPHEN 650 MG RE SUPP: 650.0000 mg | RECTAL | Status: DC | PRN

## 2022-06-07 MED ORDER — PERFLUTREN LIPID MICROSPHERE
1.0000 mL | INTRAVENOUS | Status: AC | PRN
  Administered 2022-06-07: 3 mL via INTRAVENOUS

## 2022-06-07 NOTE — Consult Note (Signed)
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 She is outside the time window for thrombolytics. Her exam reveals INO with impaired aDduction with the R eye. Would recommend MRI brain w/&w/out contrast to evaluate for ischemia vs demyelination vs other pathology.  Our recommendations are outlined below.  Recommendations:        Stroke/Telemetry Floor       Neuro Checks       Bedside Swallow Eval       DVT Prophylaxis       IV Fluids, Normal Saline       Head of Bed 30 Degrees       Euglycemia and Avoid Hyperthermia (PRN Acetaminophen)       Initiate or continue Aspirin 81 MG daily       Antihypertensives PRN if Blood pressure is greater than 220/120 or there is a concern for End organ damage/contraindications for permissive HTN. If blood pressure is greater than 220/120 give labetalol PO or IV or Vasotec IV with a goal of 15% reduction in BP during the first 24 hours.  Per facility request will defer further work up, management, and referrals to inpatient service, inclusive of inpatient neurology consult.  Sign Out:       Discussed with Emergency Department Provider    ------------------------------------------------------------------------------  Advanced Imaging: CTA Head and Neck Completed.  LVO:No  Pamela Trevino doesn't meet criteria for emergent NIR consideration   Metrics: Last Known Well: 06/06/2022 13:00:21 [facility] Notification Time: 06/06/2022 22:24:55 Arrival Time: 06/06/2022 22:04:52 Stamp Time: 06/06/2022 22:24:55 Initial Response Time: 06/06/2022 22:32:31 Symptoms:  diplopia. Initial Pamela Trevino interaction: 06/06/2022 22:54:50 NIHSS Assessment Completed: 06/06/2022 22:54:51 Pamela Trevino is not a candidate for Thrombolytic. Thrombolytic Medical Decision: 06/06/2022 23:39:00 Pamela Trevino was not deemed candidate for Thrombolytic because of following reasons: Last Well Known Above 4.5 Hours.  CT head showed no acute hemorrhage or acute core infarct.  Primary Provider Notified of Diagnostic Impression and Management Plan on: 06/06/2022 23:41:08    ------------------------------------------------------------------------------  Additional Comments:     Additional information became available which did not exist upon initial assessment  History of Present Illness: Pamela Trevino is a 71 year old Female.  Pamela Trevino was brought by private transportation with symptoms of diplopia. Pt was at home watching TV and noted a headache in the occipital region of the head and diplopia in the R eye. When she tried to get up she noted gait imbalance as well. Pt has had a headache since this afternoon. Then she was sitting down and watching TV when she started to cough and felt something snap in the back of her head and then started seeing double. When she got up to see what was going on she also noted gait imbalance. She reports associated nausea, no vomiting. This has never happened before, no other associated bulbar or focal motor deficits.     Past Medical History:      Hypertension      Hyperlipidemia      There is no history of Diabetes Mellitus      There is no history of  Atrial Fibrillation      There is no history of Coronary Artery Disease      There is no history of Stroke      There is no history of Covid-19      There is no history of Seizures      There is no history of Migraine Headaches  Medications:  No Anticoagulant use  No Antiplatelet use Reviewed EMR for current medications  Allergies:  NKDA  Social History: Smoking: No Alcohol Use: No Drug Use:  No  Family History:  There is no family history of premature cerebrovascular disease pertinent to this consultation  ROS : 14 Points Review of Systems was performed and was negative except mentioned in HPI.  Past Surgical History: There Is No Surgical History Contributory To Today's Visit     Examination: BP(117/67), Pulse(79), Blood Glucose(112) 1A: Level of Consciousness - Alert; keenly responsive + 0 1B: Ask Month and Age - Both Questions Right + 0 1C: Blink Eyes & Squeeze Hands - Performs Both Tasks + 0 2: Test Horizontal Extraocular Movements - Forced Gaze Palsy: Cannot Be Overcome + 2 3: Test Visual Fields - No Visual Loss + 0 4: Test Facial Palsy (Use Grimace if Obtunded) - Normal symmetry + 0 5A: Test Left Arm Motor Drift - No Drift for 10 Seconds + 0 5B: Test Right Arm Motor Drift - No Drift for 10 Seconds + 0 6A: Test Left Leg Motor Drift - No Drift for 5 Seconds + 0 6B: Test Right Leg Motor Drift - No Drift for 5 Seconds + 0 7: Test Limb Ataxia (FNF/Heel-Shin) - No Ataxia + 0 8: Test Sensation - Normal; No sensory loss + 0 9: Test Language/Aphasia - Normal; No aphasia + 0 10: Test Dysarthria - Normal + 0 11: Test Extinction/Inattention - No abnormality + 0  NIHSS Score: 2   Pre-Morbid Modified Rankin Scale: 0 Points = No symptoms at all   Pamela Trevino/Family was informed the Neurology Consult would occur via TeleHealth consult by way of interactive audio and video telecommunications and consented to receiving care in this manner.   Pamela Trevino is being evaluated for possible acute neurologic impairment and high probability of imminent or life-threatening deterioration. I spent total of 35 minutes providing care to this Pamela Trevino, including time for face to face visit via telemedicine, review of medical records, imaging studies and discussion of findings with providers, the Pamela Trevino and/or family.   Dr Elzie Rings   [facility] [phone]   Case  [id number]

## 2022-06-07 NOTE — Plan of Care (Signed)
Neurology plan of care  Patient was seen by teleneurology earlier today; please see their consult note. I evaluated her at bedside this afternoon, she does have R INO on examination, significantly improved since teleneuro consult. MRI brain wo contrast negative for acute infarct or other acute abnl (personal review). MRI contrasted scans are pending. TTE showed no intracardiac clot but did show moderate LA dilation. Will f/u MRI w contrast and give further recommendations in AM.  Su Monks, MD [facility] [phone]  If 7pm- 7am, please page neurology on call as listed in [place].

## 2022-06-07 NOTE — Assessment & Plan Note (Signed)
BP elevated but will hold antihypertensives to allow for permissive hypertension

## 2022-06-07 NOTE — Progress Notes (Signed)
Code stroke activated at 2223. Patient has already been to and returned from CT.  MRS 0.

## 2022-06-07 NOTE — Progress Notes (Signed)
Anticoagulation monitoring(Lovenox):  71 yo female ordered Lovenox 40 mg Q24h    Filed Weights   06/07/22 0026  Weight: 122.5 kg (270 lb)   BMI 44.9    Lab Results  Component Value Date   CREATININE 1.07 (H) 06/06/2022   CREATININE 1.09 (H) 02/26/2021   Estimated Creatinine Clearance: 64.3 mL/min (A) (by C-G formula based on SCr of 1.07 mg/dL (H)). Hemoglobin & Hematocrit     Component Value Date/Time   HGB 13.2 06/06/2022 2222   HCT 41.4 06/06/2022 2222     Per Protocol for Patient with estCrcl > 30 ml/min and BMI > 30, will transition to Lovenox 62.5 mg Q24h.

## 2022-06-07 NOTE — ED Notes (Signed)
Report given to [facility], Pt sent to MRI and will be take to rm 233 after the MRI.

## 2022-06-07 NOTE — Evaluation (Signed)
Occupational Therapy Evaluation Patient Details Name: Pamela Trevino MRN: [id number] DOB: [birth year] Today's Date: 06/07/2022   History of Present Illness Pamela Trevino is a 71 y.o. 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 The diplopia resolves when closing the right eye. The symptoms appeared to start after a bout of coughing but she states she had been having headache in the hours prior.  Initial CT head showed no acute findings.  CTA head and neck showed no LVO she was seen by teleneurology who considered the last known well time to be the onset of headache at 1300 and found her to be outside the tPA window.  MRI brain with and without contrast was recommended to evaluate for ischemia versus demyelination versus other pathology.  Additional data review: BP on arrival 172/85 with otherwise normal vitals.  Blood work unremarkable except for creatinine of 1.07 which appears to be her baseline.  MRI ordered.  Hospitalist consulted for admission.   Clinical Impression   Patient presenting with decreased Ind in self care, balance, functional mobility/transfers, endurance, and safety awareness. Patient reports being Ind at baseline and living with husband. She has AD but does not use and is beru independent.  Patient currently functioning at min guard - min A for functional mobility and self care tasks. Pt is experiencing diplopia and L eye exotropia during visual assessment. Pt would likely benefit from eye patch to increase safety with self care and functional mobility tasks. Patient will benefit from acute OT to increase overall independence in the areas of ADLs, functional mobility, and safety awareness in order to safely  discharge home with family.      Recommendations for follow up therapy are one component of a multi-disciplinary discharge planning process, led by the attending physician.  Recommendations may be updated based on patient status, additional functional criteria and insurance authorization.   Follow Up Recommendations  Home health OT    Assistance Recommended at Discharge Intermittent Supervision/Assistance  Patient can return home with the following A little help with walking and/or transfers;A little help with bathing/dressing/bathroom;Assistance with cooking/housework;Help with stairs or ramp for entrance;Assist for transportation    Functional Status Assessment  Patient has had a recent decline in their functional status and demonstrates the ability to make significant improvements in function in a reasonable and predictable amount of time.  Equipment Recommendations  None recommended by OT       Precautions / Restrictions Precautions Precautions: Fall Restrictions Weight Bearing Restrictions: No      Mobility Bed Mobility Overal bed mobility: Needs Assistance Bed Mobility: Sit to Supine       Sit to supine: Supervision        Transfers Overall transfer level: Needs assistance Equipment used: 1 person hand held assist Transfers: Sit to/from Stand, Bed to chair/wheelchair/BSC Sit to Stand: Min guard     Step pivot transfers: Min assist            Balance Overall balance assessment: Needs assistance Sitting-balance support: Feet supported Sitting balance-Leahy Scale: Good Sitting balance - Comments: seated in chair for bathing tasks     Standing balance-Leahy Scale: Fair  ADL either performed or assessed with clinical judgement   ADL Overall ADL's : Needs assistance/impaired         Upper Body Bathing: Set up;Sitting   Lower Body Bathing: Sit to/from stand;Min guard   Upper Body Dressing : Set up;Sitting    Lower Body Dressing: Sit to/from stand;Min guard               Functional mobility during ADLs: Min guard;Minimal assistance       Vision Patient Visual Report: Diplopia Vision Assessment?: Yes Eye Alignment: Impaired (comment) (L eye exotropia noted) Ocular Range of Motion: Within Functional Limits Saccades: Decreased speed of saccadic movement Convergence: Within functional limits Visual Fields: No apparent deficits Diplopia Assessment: Objects split on top of one another            Pertinent Vitals/Pain Pain Assessment Pain Assessment: No/denies pain     Hand Dominance Right   Extremity/Trunk Assessment Upper Extremity Assessment Upper Extremity Assessment: Generalized weakness;Overall WFL for tasks assessed LUE Deficits / Details: grip and strength  deficits L>R LUE Coordination: WNL   Lower Extremity Assessment Lower Extremity Assessment: Defer to PT evaluation LLE Deficits / Details: LLE 3+/5 LLE Sensation: WNL;decreased light touch LLE Coordination: WNL;decreased gross motor       Communication Communication Communication: No difficulties   Cognition Arousal/Alertness: Awake/alert Behavior During Therapy: WFL for tasks assessed/performed Overall Cognitive Status: Within Functional Limits for tasks assessed                                                  Home Living Family/patient expects to be discharged to:: Private residence Living Arrangements: Spouse/significant other Available Help at Discharge: Available 24 hours/day Type of Home: House Home Access: Stairs to enter (3 with HR)     Home Layout: One level     Bathroom Shower/Tub: Walk-in shower         Home Equipment: Shower seat;Cane - single point;Rollator (4 wheels)   Additional Comments: Pt has equipment but does not utilize at baseline      Prior Functioning/Environment Prior Level of Function : Independent/Modified Independent;Driving                         OT Problem List: Decreased strength;Decreased activity tolerance;Impaired balance (sitting and/or standing);Decreased safety awareness;Decreased knowledge of use of DME or AE      OT Treatment/Interventions: Self-care/ADL training;Balance training;Therapeutic exercise;Therapeutic activities;DME and/or AE instruction;Patient/family education    OT Goals(Current goals can be found in the care plan section) Acute Rehab OT Goals Patient Stated Goal: to go home OT Goal Formulation: With patient Time For Goal Achievement: 06/21/22 Potential to Achieve Goals: Fair ADL Goals Pt Will Perform Grooming: with supervision;standing Pt Will Perform Lower Body Dressing: with supervision;sit to/from stand Pt Will Transfer to Toilet: with supervision;ambulating Pt Will Perform Toileting - Clothing Manipulation and hygiene: with supervision;sit to/from stand  OT Frequency: Min 2X/week       AM-PAC OT "6 Clicks" Daily Activity     Outcome Measure Help from another person eating meals?: None Help from another person taking care of personal grooming?: None Help from another person toileting, which includes using toliet, bedpan, or urinal?: A Little Help from another person bathing (including washing, rinsing, drying)?: A Little Help from another person to put on and taking off regular  upper body clothing?: None Help from another person to put on and taking off regular lower body clothing?: A Little 6 Click Score: 21   End of Session Nurse Communication: Mobility status  Activity Tolerance: Patient tolerated treatment well Patient left: in bed;with call bell/phone within reach;with bed alarm set  OT Visit Diagnosis: Unsteadiness on feet (R26.81);Repeated falls (R29.6);Muscle weakness (generalized) (M62.81)                Time: 0981-1914 OT Time Calculation (min): 19 min Charges:  OT General Charges $OT Visit: 1 Visit OT Evaluation $OT Eval Moderate Complexity: 1 Mod OT  Treatments $Self Care/Home Management : 8-22 mins  Darleen Crocker, MS, OTR/L , CBIS ascom [phone]  06/07/22, 3:48 PM

## 2022-06-07 NOTE — Progress Notes (Signed)
*  PRELIMINARY RESULTS* Echocardiogram 2D Echocardiogram has been performed. Definity IV ultrasound imaging agent used on this study.  Claretta Fraise 06/07/2022, 12:15 PM

## 2022-06-07 NOTE — Progress Notes (Signed)
Dr. Fayrene Helper connected at 2252

## 2022-06-07 NOTE — H&P (Addendum)
History and Physical    Patient: Pamela Trevino DOB: [birth year] DOA: 06/06/2022 DOS: the patient was seen and examined on 06/07/2022 PCP: Ulyess Blossom, PA  Patient coming from: Home  Chief Complaint: No chief complaint on file.   HPI: Pamela Trevino is a 71 y.o. 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 The diplopia resolves when closing the right eye. The symptoms appeared to start after a bout of coughing but she states she had been having headache in the hours prior.  Initial CT head showed no acute findings.  CTA head and neck showed no LVO she was seen by teleneurology who considered the last known well time to be the onset of headache at 1300 and found her to be outside the tPA window.  MRI brain with and without contrast was recommended to evaluate for ischemia versus demyelination versus other pathology. Additional data review: BP on arrival 172/85 with otherwise normal vitals.  Blood work unremarkable except for creatinine of 1.07 which appears to be her baseline. MRI ordered.  Hospitalist consulted for admission.   Review of Systems: As mentioned in the history of present illness. All other systems reviewed and are negative.  Past Medical History:  Diagnosis Date   Breast cancer ([place]) 04/1997   right breast cancer, chemo and radiation   Hypertension    Personal history of chemotherapy    Personal history of radiation therapy    Past Surgical History:  Procedure Laterality Date   BREAST BIOPSY Left 1998   negative   BREAST BIOPSY Right 1998   positive   BREAST LUMPECTOMY Right 1998   lumpectomy with chemo and rad tx   Social History:  reports that she has never smoked. She  has never used smokeless tobacco. She reports current alcohol use. She reports that she does not currently use drugs.  Allergies  Allergen Reactions   Oxycodone Palpitations    Raid heart rate    Family History  Problem Relation Age of Onset   Breast cancer Neg Hx     Prior to Admission medications   Medication Sig Start Date End Date Taking? Authorizing Provider  aspirin 81 MG EC tablet Take 1 tablet by mouth daily. 11/12/20   [provider]  atorvastatin (LIPITOR) 20 MG tablet Take by mouth. 12/07/20   [provider]  losartan-hydrochlorothiazide (HYZAAR) 100-12.5 MG tablet Take 1 tablet by mouth daily. 06/01/20 06/01/21  [provider]  omeprazole (PRILOSEC) 40 MG capsule Take by mouth. 11/26/20   [provider]  predniSONE (STERAPRED UNI-PAK 21 TAB) 10 MG (21) TBPK tablet Take by mouth daily. Take 6 tabs by mouth daily  for 2 days, then 5 tabs for 2 days, then 4 tabs for 2 days, then 3 tabs for 2 days, 2 tabs for 2 days, then 1 tab by mouth daily for 2 days 12/18/20   Verda Cumins, MD  Vitamin D, Ergocalciferol, (DRISDOL) 1.25 MG (50000 UNIT) CAPS capsule Take 50,000 Units by mouth once a week. 12/03/20   [provider]    Physical Exam: Vitals:   06/06/22 2245 06/06/22 2300 06/06/22 2330 06/07/22 0026  BP: 117/67 130/75 128/69   Pulse: 80 80 80   Resp: 15 (!) 24 20  Temp:      TempSrc:      SpO2: 97% 98% 100%   Weight:    122.5 kg  Height:    '5\' 5"'$  (1.651 m)   Physical Exam Vitals and nursing note reviewed.  Constitutional:      General: She is not in acute distress. HENT:     Head: Normocephalic and atraumatic.  Cardiovascular:     Rate and Rhythm: Normal rate and regular rhythm.     Heart sounds: Normal heart sounds.  Pulmonary:     Effort: Pulmonary effort is normal.     Breath sounds: Normal breath sounds.  Abdominal:     Palpations: Abdomen is soft.     Tenderness: There is no abdominal tenderness.      Labs on Admission: I have personally reviewed following labs and imaging studies  CBC: Recent Labs  Lab 06/06/22 2222  WBC 10.5  NEUTROABS 7.4  HGB 13.2  HCT 41.4  MCV 97.0  PLT 017   Basic Metabolic Panel: Recent Labs  Lab 06/06/22 2222  NA 139  K 4.3  CL 107  CO2 25  GLUCOSE 112*  BUN 30*  CREATININE 1.07*  CALCIUM 9.6   GFR: Estimated Creatinine Clearance: 64.3 mL/min (A) (by C-G formula based on SCr of 1.07 mg/dL (H)). Liver Function Tests: Recent Labs  Lab 06/06/22 2222  AST 21  ALT 16  ALKPHOS 101  BILITOT 0.6  PROT 7.7  ALBUMIN 4.1   No results for input(s): "LIPASE", "AMYLASE" in the last 168 hours. No results for input(s): "AMMONIA" in the last 168 hours. Coagulation Profile: Recent Labs  Lab 06/06/22 2222  INR 1.0   Cardiac Enzymes: No results for input(s): "CKTOTAL", "CKMB", "CKMBINDEX", "TROPONINI" in the last 168 hours. BNP (last 3 results) No results for input(s): "PROBNP" in the last 8760 hours. HbA1C: No results for input(s): "HGBA1C" in the last 72 hours. CBG: Recent Labs  Lab 06/06/22 2251  GLUCAP 112*   Lipid Profile: No results for input(s): "CHOL", "HDL", "LDLCALC", "TRIG", "CHOLHDL", "LDLDIRECT" in the last 72 hours. Thyroid Function Tests: No results for input(s): "TSH", "T4TOTAL", "FREET4", "T3FREE", "THYROIDAB" in the last 72 hours. Anemia Panel: No results for input(s): "VITAMINB12", "FOLATE", "FERRITIN", "TIBC", "IRON", "RETICCTPCT" in the last 72 hours. Urine analysis:    Component Value Date/Time   COLORURINE YELLOW (A) 02/26/2021 1817   APPEARANCEUR HAZY (A) 02/26/2021 1817   LABSPEC >1.046 (H) 02/26/2021 1817   PHURINE 5.0 02/26/2021 1817   GLUCOSEU NEGATIVE 02/26/2021 1817   HGBUR MODERATE (A) 02/26/2021 1817   BILIRUBINUR NEGATIVE 02/26/2021 1817   KETONESUR 5 (A) 02/26/2021 1817   PROTEINUR 100 (A) 02/26/2021 1817   NITRITE NEGATIVE 02/26/2021 1817   LEUKOCYTESUR NEGATIVE 02/26/2021 1817     Radiological Exams on Admission: CT ANGIO HEAD NECK W WO CM (CODE STROKE)  Result Date: 06/06/2022 CLINICAL DATA:  Acute neurologic deficit EXAM: CT ANGIOGRAPHY HEAD AND NECK TECHNIQUE: Multidetector CT imaging of the head and neck was performed using the standard protocol during bolus administration of intravenous contrast. Multiplanar CT image reconstructions and MIPs were obtained to evaluate the vascular anatomy. Carotid stenosis measurements (when applicable) are obtained utilizing NASCET criteria, using the distal internal carotid diameter as the denominator. RADIATION DOSE REDUCTION: This exam was performed according to the departmental dose-optimization program which includes automated exposure control, adjustment of the mA and/or kV according to patient size and/or use of iterative reconstruction technique. CONTRAST:  63m OMNIPAQUE IOHEXOL 350 MG/ML SOLN COMPARISON:  Head CT 06/06/2022 FINDINGS: CTA NECK FINDINGS SKELETON: There is no bony spinal canal stenosis. No lytic or blastic lesion. OTHER NECK: Normal pharynx, larynx and major salivary glands. No cervical lymphadenopathy. Unremarkable thyroid gland. UPPER CHEST: No pneumothorax or pleural effusion. No nodules or masses. AORTIC ARCH: There is calcific atherosclerosis of the aortic arch. There is no aneurysm, dissection or hemodynamically significant stenosis of the visualized portion of the aorta. Conventional 3 vessel aortic branching pattern. The visualized proximal subclavian arteries are widely patent. RIGHT CAROTID SYSTEM: Normal without aneurysm, dissection or stenosis. LEFT CAROTID SYSTEM: Normal without aneurysm, dissection or stenosis. VERTEBRAL ARTERIES: Left dominant configuration. Both origins are clearly patent. There is no dissection, occlusion or flow-limiting stenosis to the skull base (V1-V3 segments). CTA HEAD FINDINGS POSTERIOR CIRCULATION: --Vertebral arteries: Normal V4 segments. --Inferior cerebellar arteries: Normal.  --Basilar artery: Normal. --Superior cerebellar arteries: Normal. --Posterior cerebral arteries (PCA): Normal. ANTERIOR CIRCULATION: --Intracranial internal carotid arteries: Atherosclerotic calcification of the internal carotid arteries at the skull base without hemodynamically significant stenosis. --Anterior cerebral arteries (ACA): Normal. Both A1 segments are present. Patent anterior communicating artery (a-comm). --Middle cerebral arteries (MCA): Normal. VENOUS SINUSES: As permitted by contrast timing, patent. ANATOMIC VARIANTS: None Review of the MIP images confirms the above findings. IMPRESSION: 1. No emergent large vessel occlusion or high-grade stenosis of the intracranial or cervical arteries. 2. Aortic Atherosclerosis (ICD10-I70.0). Electronically Signed   By: Ulyses Jarred M.D.   On: 06/06/2022 23:42   CT HEAD CODE STROKE WO CONTRAST  Result Date: 06/06/2022 CLINICAL DATA:  Code stroke.  Acute neurologic deficit EXAM: CT HEAD WITHOUT CONTRAST TECHNIQUE: Contiguous axial images were obtained from the base of the skull through the vertex without intravenous contrast. RADIATION DOSE REDUCTION: This exam was performed according to the departmental dose-optimization program which includes automated exposure control, adjustment of the mA and/or kV according to patient size and/or use of iterative reconstruction technique. COMPARISON:  None Available. FINDINGS: Brain: There is no mass, hemorrhage or extra-axial collection. The size and configuration of the ventricles and extra-axial CSF spaces are normal. There is hypoattenuation of the periventricular white matter, most commonly indicating chronic ischemic microangiopathy. Vascular: Atherosclerotic calcification of the vertebral and internal carotid arteries at the skull base. No abnormal hyperdensity of the major intracranial arteries or dural venous sinuses. Skull: The visualized skull base, calvarium and extracranial soft tissues are normal.  Sinuses/Orbits: No fluid levels or advanced mucosal thickening of the visualized paranasal sinuses. No mastoid or middle ear effusion. The orbits are normal. ASPECTS [facility] Stroke Program Early CT Score) - Ganglionic level infarction (caudate, lentiform nuclei, internal capsule, insula, M1-M3 cortex): 7 - Supraganglionic infarction (M4-M6 cortex): 3 Total score (0-10 with 10 being normal): 10 IMPRESSION: 1. No acute intracranial abnormality. 2. ASPECTS is 10. These results were called by telephone at the time of interpretation on 06/06/2022 at 10:21 pm to provider Merlyn Lot , who verbally acknowledged these results. Electronically Signed   By: Ulyses Jarred M.D.   On: 06/06/2022 22:21     Data Reviewed: Relevant notes from primary care and specialist visits, past discharge summaries as available in EHR, including Care Everywhere. Prior diagnostic testing as pertinent to current admission diagnoses Updated medications and problem lists for reconciliation ED course, including vitals, labs, imaging, treatment and response to treatment Triage notes, nursing and pharmacy notes and ED provider's notes Notable results as noted in HPI   Assessment and Plan: * Acute focal neurological deficit , suspect acute CVA Presenting with diplopia, unsteady  gait, outside tPA Seen by teleneurology Recommendations:         Stroke/Telemetry Floor       Neuro Checks       Bedside Swallow Eval       DVT Prophylaxis       IV Fluids, Normal Saline       Head of Bed 30 Degrees       Euglycemia and Avoid Hyperthermia (PRN Acetaminophen)       Initiate or continue Aspirin 81 MG daily       Antihypertensives PRN if Blood pressure is greater than 220/120 or there is a concern for End organ damage/contraindications for permissive HTN. If blood pressure is greater than 220/120 give labetalol PO or IV or Vasotec IV with a goal of 15% reduction in BP during the first 24 hours.  Follow up MRI Neurology to  follow  History of breast cancer 1998 s/p lumpectomy, chemo and radiation  Hypertension BP elevated but will hold antihypertensives to allow for permissive hypertension  History of ovarian cancer 2022 S/p debulking, 3 cycles of chemo Continue letrozole        DVT prophylaxis: Lovenox  Consults: Neurologist, Dr.Stack  Advance Care Planning:   Code Status: Not on file dull  Family Communication: none  Disposition Plan: Back to previous home environment  Severity of Illness: At the time of admission, it appears that the appropriate admission status for this patient is INPATIENT. This is judged to be reasonable and necessary in order to provide the required intensity of service to ensure the patient's safety given the presenting symptoms, physical exam findings, and initial radiographic and laboratory data in the context of their  Comorbid conditions.   Patient requires inpatient status due to high intensity of service, high risk for further deterioration and high frequency of surveillance required.   I certify that at the point of admission it is my clinical judgment that the patient will require inpatient hospital care spanning beyond 2 midnights .   Author: Athena Masse, MD 06/07/2022 12:41 AM  For on call review [email or web address].

## 2022-06-07 NOTE — Assessment & Plan Note (Signed)
s/p lumpectomy, chemo and radiation

## 2022-06-07 NOTE — Progress Notes (Signed)
Reported negative CTA results to Dr. Fayrene Helper at [facility]

## 2022-06-07 NOTE — Assessment & Plan Note (Addendum)
Presenting with diplopia, unsteady gait, outside tPA Seen by teleneurology Recommendations:      .  Stroke/Telemetry Floor     .  Neuro Checks     .  Bedside Swallow Eval     .  DVT Prophylaxis     .  IV Fluids, Normal Saline     .  Head of Bed 30 Degrees     .  Euglycemia and Avoid Hyperthermia (PRN Acetaminophen)     .  Initiate or continue Aspirin 81 MG daily     .  Antihypertensives PRN if Blood pressure is greater than 220/120 or there is a concern for End organ damage/contraindications for permissive HTN. If blood pressure is greater than 220/120 give labetalol PO or IV or Vasotec IV with a goal of 15% reduction in BP during the first 24 hours.  Follow up MRI Neurology to follow

## 2022-06-07 NOTE — Assessment & Plan Note (Addendum)
S/p debulking, 3 cycles of chemo Continue letrozole

## 2022-06-07 NOTE — ED Notes (Signed)
Pamela Trevino [id number]

## 2022-06-07 NOTE — Evaluation (Signed)
Physical Therapy Evaluation Patient Details Name: Pamela Trevino MRN: [id number] DOB: February 02, 1951 Today's Date: 06/07/2022  History of Present Illness  Pamela Trevino is a 71 y.o. 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 The diplopia resolves when closing the right eye. The symptoms appeared to start after a bout of coughing but she states she had been having headache in the hours prior.  Initial CT head showed no acute findings.  CTA head and neck showed no LVO she was seen by teleneurology who considered the last known well time to be the onset of headache at 1300 and found her to be outside the tPA window.  MRI brain with and without contrast was recommended to evaluate for ischemia versus demyelination versus other pathology.  Additional data review: BP on arrival 172/85 with otherwise normal vitals.  Blood work unremarkable except for creatinine of 1.07 which appears to be her baseline.  MRI ordered.  Hospitalist consulted for admission.  Clinical Impression  Pt is motivated and pleasant who agreed to participate in PT evaluation. Pt chief clo double vision and dizziness at rest. PT completed the evaluation with L eye closed which improve pt's dizziness and depth perception. Pt awaiting another MRI. Pt performed bed mobility with independently. MMT revealed L side weaker then R. Tranfers STS and Bed-> W/C with CGA of 1. Ambulated in room with RW with CGA of 1. Pt at risk of fall due ot vision deficits. Pt will benefit from eye patch to improve vision an function and reduce fall risk. Nurses provided pt with eye patch. Pt wil benefit form HH PT when discharged form acute care.     Recommendations for follow up therapy are  one component of a multi-disciplinary discharge planning process, led by the attending physician.  Recommendations may be updated based on patient status, additional functional criteria and insurance authorization.  Follow Up Recommendations Home health PT    Assistance Recommended at Discharge Intermittent Supervision/Assistance  Patient can return home with the following  A little help with walking and/or transfers;A little help with bathing/dressing/bathroom;Assistance with cooking/housework;Direct supervision/assist for financial management;Assist for transportation;Help with stairs or ramp for entrance    Equipment Recommendations Rolling walker (2 wheels);BSC/3in1  Recommendations for Other Services       Functional Status Assessment Patient has had a recent decline in their functional status and demonstrates the ability to make significant improvements in function in a reasonable and predictable amount of time.     Precautions / Restrictions Precautions Precautions: Fall Restrictions Weight Bearing Restrictions: No      Mobility  Bed Mobility Overal bed mobility: Needs Assistance Bed Mobility: Supine to Sit     Supine to sit: Supervision          Transfers Overall transfer level: Needs assistance Equipment used: Rolling walker (2 wheels) Transfers: Sit to/from Stand, Bed to chair/wheelchair/BSC Sit to Stand: Min guard                Ambulation/Gait Ambulation/Gait assistance: Min guard Gait Distance (Feet): 30 Feet Assistive device: Rolling walker (2 wheels) Gait Pattern/deviations: Step-through pattern (uneven step length) Gait velocity: decreased        Science writer  Modified Rankin (Stroke Patients Only)       Balance Overall balance assessment: Needs assistance Sitting-balance support: Feet supported Sitting balance-Leahy Scale: Good     Standing balance support: Bilateral upper extremity  supported Standing balance-Leahy Scale: Fair (due to double vision and dizziness)                               Pertinent Vitals/Pain Pain Assessment Pain Assessment: No/denies pain    Home Living Family/patient expects to be discharged to:: Private residence Living Arrangements: Spouse/significant other Available Help at Discharge: Available 24 hours/day Type of Home: House Home Access: Stairs to enter (3 with HR)       Home Layout: One level   Additional Comments: Pt independent withotu need for equipments    Prior Function Prior Level of Function : Independent/Modified Independent;Driving                     Hand Dominance   Dominant Hand: Right    Extremity/Trunk Assessment   Upper Extremity Assessment Upper Extremity Assessment: LUE deficits/detail LUE Deficits / Details: grip and strength  deficits L>R LUE Coordination: WNL    Lower Extremity Assessment Lower Extremity Assessment: Generalized weakness;LLE deficits/detail LLE Deficits / Details: LLE 3+/5 LLE Sensation: WNL;decreased light touch LLE Coordination: WNL;decreased gross motor       Communication   Communication: No difficulties  Cognition Arousal/Alertness: Awake/alert Behavior During Therapy: WFL for tasks assessed/performed Overall Cognitive Status: Within Functional Limits for tasks assessed                                          General Comments      Exercises General Exercises - Lower Extremity Ankle Circles/Pumps: 20 reps, AROM, Seated Gluteal Sets: Seated, 10 reps Heel Slides: 10 reps, Seated, AROM   Assessment/Plan    PT Assessment Patient needs continued PT services  PT Problem List Decreased strength;Decreased activity tolerance;Decreased balance;Decreased mobility       PT Treatment Interventions Gait training;Stair training;Therapeutic activities;Therapeutic exercise;Functional mobility training;Balance training;Neuromuscular  re-education    PT Goals (Current goals can be found in the Care Plan section)  Acute Rehab PT Goals Patient Stated Goal: I want to see well and get better llike before. PT Goal Formulation: With patient Time For Goal Achievement: 06/21/22 Potential to Achieve Goals: Good    Frequency Min 2X/week     Co-evaluation               AM-PAC PT "6 Clicks" Mobility  Outcome Measure Help needed turning from your back to your side while in a flat bed without using bedrails?: None Help needed moving from lying on your back to sitting on the side of a flat bed without using bedrails?: None Help needed moving to and from a bed to a chair (including a wheelchair)?: A Little Help needed standing up from a chair using your arms (e.g., wheelchair or bedside chair)?: A Little Help needed to walk in hospital room?: A Little Help needed climbing 3-5 steps with a railing? : A Lot 6 Click Score: 19    End of Session Equipment Utilized During Treatment: Gait belt Activity Tolerance: Patient tolerated treatment well;Treatment limited secondary to medical complications (Comment) (complication of double vision.) Patient left: in chair;with call bell/phone within reach Nurse Communication: Mobility status PT Visit  Diagnosis: Unsteadiness on feet (R26.81);Muscle weakness (generalized) (M62.81);Dizziness and giddiness (R42)    Time: 2591-0289 PT Time Calculation (min) (ACUTE ONLY): 29 min   Charges:   PT Evaluation $PT Eval Moderate Complexity: 1 Mod PT Treatments $Gait Training: 8-22 mins $Therapeutic Exercise: 8-22 mins        Keeana Pieratt PT DPT 3:31 PM,06/07/22   Yicel Shannon H Raphael Fitzpatrick 06/07/2022, 3:25 PM

## 2022-06-08 DIAGNOSIS — R29818 Other symptoms and signs involving the nervous system: Secondary | ICD-10-CM | POA: Diagnosis not present

## 2022-06-08 LAB — CBC
HCT: 39.1 % (ref 36.0–46.0)
Hemoglobin: 12.8 g/dL (ref 12.0–15.0)
MCH: 31.4 pg (ref 26.0–34.0)
MCHC: 32.7 g/dL (ref 30.0–36.0)
MCV: 96.1 fL (ref 80.0–100.0)
Platelets: 217 10*3/uL (ref 150–400)
RBC: 4.07 MIL/uL (ref 3.87–5.11)
RDW: 13.6 % (ref 11.5–15.5)
WBC: 6.3 10*3/uL (ref 4.0–10.5)
nRBC: 0 % (ref 0.0–0.2)

## 2022-06-08 LAB — BASIC METABOLIC PANEL
Anion gap: 5 (ref 5–15)
BUN: 22 mg/dL (ref 8–23)
CO2: 27 mmol/L (ref 22–32)
Calcium: 9.6 mg/dL (ref 8.9–10.3)
Chloride: 109 mmol/L (ref 98–111)
Creatinine, Ser: 0.79 mg/dL (ref 0.44–1.00)
GFR, Estimated: >60 mL/min (ref >60)
Glucose, Bld: 113 mg/dL — ABNORMAL HIGH (ref 70–99)
Potassium: 4.1 mmol/L (ref 3.5–5.1)
Sodium: 141 mmol/L (ref 135–145)

## 2022-06-08 LAB — MAGNESIUM: Magnesium: 2.2 mg/dL (ref 1.7–2.4)

## 2022-06-08 MED ORDER — LOSARTAN POTASSIUM-HCTZ 100-12.5 MG PO TABS: ORAL_TABLET | ORAL | 24 refills | Status: AC

## 2022-06-08 NOTE — Progress Notes (Signed)
Pamela Trevino to be D/C'd Home per MD order.  Discussed prescriptions and follow up appointments with the patient. No Prescriptions given to patient, medication list explained in detail. Pt and husband verbalized understanding.  Allergies as of 06/08/2022       Reactions   Oxycodone Palpitations   Raid heart rate        Medication List     STOP taking these medications    omeprazole 40 MG capsule Commonly known as: PRILOSEC   predniSONE 10 MG (21) Tbpk tablet Commonly known as: STERAPRED UNI-PAK 21 TAB       TAKE these medications    aspirin EC 81 MG tablet Take 1 tablet by mouth daily.   atorvastatin 20 MG tablet Commonly known as: LIPITOR Take by mouth.   cyclobenzaprine 10 MG tablet Commonly known as: FLEXERIL Take 10 mg by mouth 2 (two) times daily as needed.   docusate sodium 100 MG capsule Commonly known as: COLACE Take 100 mg by mouth daily as needed.   famotidine 20 MG tablet Commonly known as: PEPCID Take 20 mg by mouth daily as needed.   gabapentin 100 MG capsule Commonly known as: NEURONTIN Take 100 mg by mouth 2 (two) times daily.   letrozole 2.5 MG tablet Commonly known as: FEMARA Take 2.5 mg by mouth daily.   losartan-hydrochlorothiazide 100-12.5 MG tablet Commonly known as: HYZAAR Hold until followup with outpatient provider due to intermittent low blood pressure. What changed:  how much to take how to take this when to take this additional instructions   Vitamin D (Ergocalciferol) 1.25 MG (50000 UNIT) Caps capsule Commonly known as: DRISDOL Take 50,000 Units by mouth once a week.        Vitals:   06/08/22 0626 06/08/22 0832  BP: 137/67 (!) 109/54  Pulse: 70 68  Resp: 18 18  Temp: 98 F (36.7 C) 97.9 F (36.6 C)  SpO2: 98% 97%    Skin clean, dry and intact without evidence of skin break down, no evidence of skin tears noted. IV catheter discontinued intact. Site without signs and symptoms of complications. Dressing and  pressure applied. Pt denies pain at this time. No complaints noted.  An After Visit Summary was printed and given to the patient. Patient escorted via [place], and D/C home via private auto.  [place]

## 2022-06-08 NOTE — TOC Progression Note (Addendum)
Transition of Care [facility]) - Progression Note    Patient Details  Name: Pamela Trevino MRN: [id number] Date of Birth: 04/13/1951  Transition of Care [facility] CM/SW Contact  Eileen Stanford, LCSW Phone Number: 06/08/2022, 11:44 AM  Clinical Narrative:    Pt agreeable to [facility]- no preference. Referral given to [facility] with [facility]--will accept.  Pt declined walker.        Expected Discharge Plan and Services           Expected Discharge Date: 06/08/22                                     Social Determinants of Health (SDOH) Interventions    Readmission Risk Interventions     No data to display

## 2022-06-08 NOTE — Discharge Summary (Signed)
Physician Discharge Summary   Pamela Trevino  female DOB: 07-Mar-1951  LOV:[id number]  PCP: Ulyess Blossom, PA  Admit date: 06/06/2022 Discharge date: 06/08/2022  Admitted From: home Disposition:  home Home Health: Yes CODE STATUS: Full code  Discharge Instructions     Ambulatory referral to Neurology   Complete by: As directed    An appointment is requested in approximately: 4 wks      Hospital Course:  For full details, please see H&P, progress notes, consult notes and ancillary notes.  Briefly,  Pamela Trevino is a 71 y.o. female with medical history significant for HTN, HLD, CKD llla, breast cancer, low-grade ovarian cancer s/p debulking/chemotherapy completed 06/2021 and now on letrozole, who came in to the ED as a code stroke after experiencing sudden onset double vision associated with dizziness and feeling off balance while walking. The diplopia resolves when closing the right eye.   * Right Internuclear Ophthalmoplegia Presenting with diplopia, unsteady gait.  MRI brain w and w/o neg for acute stroke or other acute finding.  CTA head/neck without large vessel occlusion or high-grade stenosis.  TTE showed mod dilated left atrium but no intra-cardiac thrombus or shunt.  Neuro consulted and cx with [facility].   --Diplopia improved prior to discharge.  Symptoms of dizziness improved with wearing an eye patch.  Pt will follow up with outpatient neuro.  History of breast cancer 1998 s/p lumpectomy, chemo and radiation   Hypertension Home Hyzaar held on presentation for permissive HTN, and BP had been mostly normal with occasional high and low without BP medication. --continue to hold home Hyzaar pending outpatient f/u.   History of ovarian cancer 2022 S/p debulking, 3 cycles of chemo Continue home letrozole   Medications under "STOP" list are ones pt wasn't taking PTA.   Discharge Diagnoses:  Principal Problem:   Acute focal neurological deficit , suspect acute  CVA Active Problems:   History of ovarian cancer 2022   Hypertension   History of breast cancer 1998   30 Day Unplanned Readmission Risk Score    Flowsheet Row ED to Hosp-Admission (Current) from 06/06/2022 in [facility]  30 Day Unplanned Readmission Risk Score (%) 9.43 Filed at 06/08/2022 0801       This score is the patient's risk of an unplanned readmission within 30 days of being discharged (0 -100%). The score is based on dignosis, age, lab data, medications, orders, and past utilization.   Low:  0-14.9   Medium: 15-21.9   High: 22-29.9   Extreme: 30 and above         Discharge Instructions:  Allergies as of 06/08/2022       Reactions   Oxycodone Palpitations   Raid heart rate        Medication List     STOP taking these medications    omeprazole 40 MG capsule Commonly known as: PRILOSEC   predniSONE 10 MG (21) Tbpk tablet Commonly known as: STERAPRED UNI-PAK 21 TAB       TAKE these medications    aspirin EC 81 MG tablet Take 1 tablet by mouth daily.   atorvastatin 20 MG tablet Commonly known as: LIPITOR Take by mouth.   cyclobenzaprine 10 MG tablet Commonly known as: FLEXERIL Take 10 mg by mouth 2 (two) times daily as needed.   docusate sodium 100 MG capsule Commonly known as: COLACE Take 100 mg by mouth daily as needed.   famotidine 20 MG tablet Commonly known as: PEPCID  Take 20 mg by mouth daily as needed.   gabapentin 100 MG capsule Commonly known as: NEURONTIN Take 100 mg by mouth 2 (two) times daily.   letrozole 2.5 MG tablet Commonly known as: FEMARA Take 2.5 mg by mouth daily.   losartan-hydrochlorothiazide 100-12.5 MG tablet Commonly known as: HYZAAR Hold until followup with outpatient provider due to intermittent low blood pressure. What changed:  how much to take how to take this when to take this additional instructions   Vitamin D (Ergocalciferol) 1.25 MG (50000 UNIT) Caps capsule Commonly  known as: DRISDOL Take 50,000 Units by mouth once a week.         Follow-up Information     Ulyess Blossom, PA Follow up in 1 week(s).   Specialty: Physician Assistant Contact information: [place] [address] [phone]                 Allergies  Allergen Reactions   Oxycodone Palpitations    Raid heart rate     The results of significant diagnostics from this hospitalization (including imaging, microbiology, ancillary and laboratory) are listed below for reference.   Consultations:   Procedures/Studies: MR BRAIN W CONTRAST  Result Date: 06/07/2022 CLINICAL DATA:  Possible demyelination EXAM: MRI HEAD WITH CONTRAST TECHNIQUE: Multiplanar, multiecho pulse sequences of the brain and surrounding structures were obtained with intravenous contrast. CONTRAST:  32m GADAVIST GADOBUTROL 1 MMOL/ML IV SOLN COMPARISON:  MRI brain without contrast 06/07/2022 at 9:14 p.m. FINDINGS: There is no abnormal contrast enhancement. No new findings compared to the noncontrast examination. IMPRESSION: No abnormal contrast enhancement. Electronically Signed   By: KUlyses JarredM.D.   On: 06/07/2022 22:05   ECHOCARDIOGRAM COMPLETE  Result Date: 06/07/2022    ECHOCARDIOGRAM REPORT   Patient Name:   Pamela TRULSONDate of Exam: 06/07/2022 Medical Rec #:  [id number]   Height:       65.0 in Accession #:    [id number]  Weight:       270.0 lb Date of Birth:  2November 28, 1952   BSA:          2.247 m Patient Age:    89years     BP:           146/75 mmHg Patient Gender: F            HR:           69 bpm. Exam Location:  [facility] Procedure: 2D Echo and Intracardiac Opacification Agent Indications:     Stroke I63.9  History:         Patient has no prior history of Echocardiogram examinations.  Sonographer:     TKathlen BrunswickRDCS Referring Phys:  [id number] MasseDiagnosing Phys: SNeoma Laming Sonographer Comments: Technically difficult study due to poor echo windows and patient is  morbidly obese. Image acquisition challenging due to patient body habitus. IMPRESSIONS  1. Left ventricular ejection fraction, by estimation, is 55 to 60%. The left ventricle has normal function. The left ventricle has no regional wall motion abnormalities. There is mild concentric left ventricular hypertrophy. Left ventricular diastolic parameters are consistent with Grade I diastolic dysfunction (impaired relaxation).  2. Right ventricular systolic function is mildly reduced. The right ventricular size is moderately enlarged.  3. Left atrial size was moderately dilated.  4. Right atrial size was moderately dilated.  5. The mitral valve is normal in structure. Trivial mitral valve regurgitation. No evidence of mitral stenosis.  6. The aortic valve is normal in structure. Aortic valve regurgitation is mild. Aortic valve sclerosis is present, with no evidence of aortic valve stenosis.  7. The inferior vena cava is normal in size with greater than 50% respiratory variability, suggesting right atrial pressure of 3 mmHg. FINDINGS  Left Ventricle: Left ventricular ejection fraction, by estimation, is 55 to 60%. The left ventricle has normal function. The left ventricle has no regional wall motion abnormalities. Definity contrast agent was given IV to delineate the left ventricular  endocardial borders. The left ventricular internal cavity size was normal in size. There is mild concentric left ventricular hypertrophy. Left ventricular diastolic parameters are consistent with Grade I diastolic dysfunction (impaired relaxation). Right Ventricle: The right ventricular size is moderately enlarged. No increase in right ventricular wall thickness. Right ventricular systolic function is mildly reduced. Left Atrium: Left atrial size was moderately dilated. Right Atrium: Right atrial size was moderately dilated. Pericardium: There is no evidence of pericardial effusion. Mitral Valve: The mitral valve is normal in structure.  Trivial mitral valve regurgitation. No evidence of mitral valve stenosis. Tricuspid Valve: The tricuspid valve is normal in structure. Tricuspid valve regurgitation is mild . No evidence of tricuspid stenosis. Aortic Valve: The aortic valve is normal in structure. Aortic valve regurgitation is mild. Aortic valve sclerosis is present, with no evidence of aortic valve stenosis. Aortic valve peak gradient measures 4.2 mmHg. Pulmonic Valve: The pulmonic valve was normal in structure. Pulmonic valve regurgitation is trivial. No evidence of pulmonic stenosis. Aorta: The aortic root is normal in size and structure. Venous: The inferior vena cava is normal in size with greater than 50% respiratory variability, suggesting right atrial pressure of 3 mmHg. IAS/Shunts: No atrial level shunt detected by color flow Doppler.  LEFT VENTRICLE PLAX 2D LVIDd:         4.12 cm      Diastology LVIDs:         2.69 cm      LV e' medial:    10.70 cm/s LV PW:         1.02 cm      LV E/e' medial:  4.2 LV IVS:        1.08 cm      LV e' lateral:   9.03 cm/s LVOT diam:     2.00 cm      LV E/e' lateral: 5.0 LV SV:         77 LV SV Index:   34 LVOT Area:     3.14 cm  LV Volumes (MOD) LV vol d, MOD A2C: 98.8 ml LV vol d, MOD A4C: 100.0 ml LV vol s, MOD A2C: 34.6 ml LV vol s, MOD A4C: 37.6 ml LV SV MOD A2C:     64.2 ml LV SV MOD A4C:     100.0 ml LV SV MOD BP:      64.3 ml LEFT ATRIUM           Index LA diam:      2.90 cm 1.29 cm/m LA Vol (A4C): 18.2 ml 8.10 ml/m  AORTIC VALVE                 PULMONIC VALVE AV Area (Vmax): 3.26 cm     PV Vmax:       1.14 m/s AV Vmax:        103.00 cm/s  PV Peak grad:  5.2 mmHg AV Peak Grad:   4.2 mmHg LVOT Vmax:  107.00 cm/s LVOT Vmean:     70.500 cm/s LVOT VTI:       0.246 m  AORTA Ao Root diam: 3.20 cm Ao Asc diam:  2.90 cm MITRAL VALVE MV Area (PHT): 2.88 cm    SHUNTS MV Decel Time: 263 msec    Systemic VTI:  0.25 m MV E velocity: 45.00 cm/s  Systemic Diam: 2.00 cm MV A velocity: 52.30 cm/s MV E/A ratio:   0.86 Shaukat Khan Electronically signed by Neoma Laming Signature Date/Time: 06/07/2022/5:11:02 PM    Final    MR BRAIN WO CONTRAST  Result Date: 06/07/2022 CLINICAL DATA:  Double vision EXAM: MRI HEAD WITHOUT CONTRAST TECHNIQUE: Multiplanar, multiecho pulse sequences of the brain and surrounding structures were obtained without intravenous contrast. COMPARISON:  None Available. FINDINGS: Brain: No acute infarct, mass effect or extra-axial collection. No acute or chronic hemorrhage. There is multifocal hyperintense T2-weighted signal within the white matter. Parenchymal volume and CSF spaces are normal. The midline structures are normal. Vascular: Major flow voids are preserved. Skull and upper cervical spine: Normal calvarium and skull base. Visualized upper cervical spine and soft tissues are normal. Sinuses/Orbits:No paranasal sinus fluid levels or advanced mucosal thickening. No mastoid or middle ear effusion. Normal orbits. IMPRESSION: 1. No acute intracranial abnormality. 2. Findings of chronic small vessel ischemia. Electronically Signed   By: Ulyses Jarred M.D.   On: 06/07/2022 02:06   CT ANGIO HEAD NECK W WO CM (CODE STROKE)  Result Date: 06/06/2022 CLINICAL DATA:  Acute neurologic deficit EXAM: CT ANGIOGRAPHY HEAD AND NECK TECHNIQUE: Multidetector CT imaging of the head and neck was performed using the standard protocol during bolus administration of intravenous contrast. Multiplanar CT image reconstructions and MIPs were obtained to evaluate the vascular anatomy. Carotid stenosis measurements (when applicable) are obtained utilizing NASCET criteria, using the distal internal carotid diameter as the denominator. RADIATION DOSE REDUCTION: This exam was performed according to the departmental dose-optimization program which includes automated exposure control, adjustment of the mA and/or kV according to patient size and/or use of iterative reconstruction technique. CONTRAST:  86m OMNIPAQUE IOHEXOL  350 MG/ML SOLN COMPARISON:  Head CT 06/06/2022 FINDINGS: CTA NECK FINDINGS SKELETON: There is no bony spinal canal stenosis. No lytic or blastic lesion. OTHER NECK: Normal pharynx, larynx and major salivary glands. No cervical lymphadenopathy. Unremarkable thyroid gland. UPPER CHEST: No pneumothorax or pleural effusion. No nodules or masses. AORTIC ARCH: There is calcific atherosclerosis of the aortic arch. There is no aneurysm, dissection or hemodynamically significant stenosis of the visualized portion of the aorta. Conventional 3 vessel aortic branching pattern. The visualized proximal subclavian arteries are widely patent. RIGHT CAROTID SYSTEM: Normal without aneurysm, dissection or stenosis. LEFT CAROTID SYSTEM: Normal without aneurysm, dissection or stenosis. VERTEBRAL ARTERIES: Left dominant configuration. Both origins are clearly patent. There is no dissection, occlusion or flow-limiting stenosis to the skull base (V1-V3 segments). CTA HEAD FINDINGS POSTERIOR CIRCULATION: --Vertebral arteries: Normal V4 segments. --Inferior cerebellar arteries: Normal. --Basilar artery: Normal. --Superior cerebellar arteries: Normal. --Posterior cerebral arteries (PCA): Normal. ANTERIOR CIRCULATION: --Intracranial internal carotid arteries: Atherosclerotic calcification of the internal carotid arteries at the skull base without hemodynamically significant stenosis. --Anterior cerebral arteries (ACA): Normal. Both A1 segments are present. Patent anterior communicating artery (a-comm). --Middle cerebral arteries (MCA): Normal. VENOUS SINUSES: As permitted by contrast timing, patent. ANATOMIC VARIANTS: None Review of the MIP images confirms the above findings. IMPRESSION: 1. No emergent large vessel occlusion or high-grade stenosis of the intracranial or cervical arteries. 2. Aortic Atherosclerosis (ICD10-I70.0).  Electronically Signed   By: Ulyses Jarred M.D.   On: 06/06/2022 23:42   CT HEAD CODE STROKE WO CONTRAST  Result  Date: 06/06/2022 CLINICAL DATA:  Code stroke.  Acute neurologic deficit EXAM: CT HEAD WITHOUT CONTRAST TECHNIQUE: Contiguous axial images were obtained from the base of the skull through the vertex without intravenous contrast. RADIATION DOSE REDUCTION: This exam was performed according to the departmental dose-optimization program which includes automated exposure control, adjustment of the mA and/or kV according to patient size and/or use of iterative reconstruction technique. COMPARISON:  None Available. FINDINGS: Brain: There is no mass, hemorrhage or extra-axial collection. The size and configuration of the ventricles and extra-axial CSF spaces are normal. There is hypoattenuation of the periventricular white matter, most commonly indicating chronic ischemic microangiopathy. Vascular: Atherosclerotic calcification of the vertebral and internal carotid arteries at the skull base. No abnormal hyperdensity of the major intracranial arteries or dural venous sinuses. Skull: The visualized skull base, calvarium and extracranial soft tissues are normal. Sinuses/Orbits: No fluid levels or advanced mucosal thickening of the visualized paranasal sinuses. No mastoid or middle ear effusion. The orbits are normal. ASPECTS [facility] Stroke Program Early CT Score) - Ganglionic level infarction (caudate, lentiform nuclei, internal capsule, insula, M1-M3 cortex): 7 - Supraganglionic infarction (M4-M6 cortex): 3 Total score (0-10 with 10 being normal): 10 IMPRESSION: 1. No acute intracranial abnormality. 2. ASPECTS is 10. These results were called by telephone at the time of interpretation on 06/06/2022 at 10:21 pm to provider Merlyn Lot , who verbally acknowledged these results. Electronically Signed   By: Ulyses Jarred M.D.   On: 06/06/2022 22:21      Labs: BNP (last 3 results) No results for input(s): "BNP" in the last 8760 hours. Basic Metabolic Panel: Recent Labs  Lab 06/06/22 2222 06/08/22 0642  NA 139 141   K 4.3 4.1  CL 107 109  CO2 25 27  GLUCOSE 112* 113*  BUN 30* 22  CREATININE 1.07* 0.79  CALCIUM 9.6 9.6  MG  --  2.2   Liver Function Tests: Recent Labs  Lab 06/06/22 2222  AST 21  ALT 16  ALKPHOS 101  BILITOT 0.6  PROT 7.7  ALBUMIN 4.1   No results for input(s): "LIPASE", "AMYLASE" in the last 168 hours. No results for input(s): "AMMONIA" in the last 168 hours. CBC: Recent Labs  Lab 06/06/22 2222 06/08/22 0642  WBC 10.5 6.3  NEUTROABS 7.4  --   HGB 13.2 12.8  HCT 41.4 39.1  MCV 97.0 96.1  PLT 238 217   Cardiac Enzymes: No results for input(s): "CKTOTAL", "CKMB", "CKMBINDEX", "TROPONINI" in the last 168 hours. BNP: Invalid input(s): "POCBNP" CBG: Recent Labs  Lab 06/06/22 2251  GLUCAP 112*   D-Dimer No results for input(s): "DDIMER" in the last 72 hours. Hgb A1c Recent Labs    06/07/22 0445  HGBA1C 5.5   Lipid Profile Recent Labs    06/07/22 0445  CHOL 145  HDL 52  LDLCALC 77  TRIG 80  CHOLHDL 2.8   Thyroid function studies No results for input(s): "TSH", "T4TOTAL", "T3FREE", "THYROIDAB" in the last 72 hours.  Invalid input(s): "FREET3" Anemia work up No results for input(s): "VITAMINB12", "FOLATE", "FERRITIN", "TIBC", "IRON", "RETICCTPCT" in the last 72 hours. Urinalysis    Component Value Date/Time   COLORURINE YELLOW (A) 02/26/2021 1817   APPEARANCEUR HAZY (A) 02/26/2021 1817   LABSPEC >1.046 (H) 02/26/2021 1817   PHURINE 5.0 02/26/2021 1817   GLUCOSEU NEGATIVE 02/26/2021 1817  HGBUR MODERATE (A) 02/26/2021 1817   BILIRUBINUR NEGATIVE 02/26/2021 1817   KETONESUR 5 (A) 02/26/2021 1817   PROTEINUR 100 (A) 02/26/2021 1817   NITRITE NEGATIVE 02/26/2021 1817   LEUKOCYTESUR NEGATIVE 02/26/2021 1817   Sepsis Labs Recent Labs  Lab 06/06/22 2222 06/08/22 0642  WBC 10.5 6.3   Microbiology No results found for this or any previous visit (from the past 240 hour(s)).   Total time spend on discharging this patient, including the  last patient exam, discussing the hospital stay, instructions for ongoing care as it relates to all pertinent caregivers, as well as preparing the medical discharge records, prescriptions, and/or referrals as applicable, is 35 minutes.    Enzo Bi, MD  [facility] 06/08/2022, 9:49 AM

## 2022-06-10 ENCOUNTER — Other Ambulatory Visit: Payer: Self-pay | Admitting: Family Medicine

## 2022-06-10 DIAGNOSIS — Z1231 Encounter for screening mammogram for malignant neoplasm of breast: Secondary | ICD-10-CM

## 2022-07-07 ENCOUNTER — Ambulatory Visit
Admission: RE | Admit: 2022-07-07 | Discharge: 2022-07-07 | Disposition: A | Discharge status: Home / Self Care | Payer: Medicare Other | Source: Ambulatory Visit | Attending: Family Medicine | Admitting: Family Medicine

## 2022-07-07 DIAGNOSIS — Z1231 Encounter for screening mammogram for malignant neoplasm of breast: Secondary | ICD-10-CM | POA: Insufficient documentation

## 2022-07-17 ENCOUNTER — Encounter: Payer: Self-pay | Admitting: Diagnostic Neuroimaging

## 2022-07-17 ENCOUNTER — Ambulatory Visit (INDEPENDENT_AMBULATORY_CARE_PROVIDER_SITE_OTHER): Payer: Medicare Other | Admitting: Diagnostic Neuroimaging

## 2022-07-17 VITALS — BP 137/68 | HR 71 | Ht 66.0 in | Wt 218.0 lb

## 2022-07-17 DIAGNOSIS — H532 Diplopia: Secondary | ICD-10-CM

## 2022-07-17 NOTE — Patient Instructions (Signed)
DOUBLE VISION, HEADACHES, DIZZINESS, HYPERTENSION (possible MRI negative brainstem stroke) - continue aspirin '81mg'$ , statin, BP control - consider sleep study - check neuromuscular labs

## 2022-07-17 NOTE — Progress Notes (Signed)
[facility]  PATIENT: Pamela Trevino DOB: 1951-02-24  REFERRING CLINICIAN: Derek Jack, MD HISTORY FROM: patient REASON FOR VISIT: new consult   HISTORICAL  CHIEF COMPLAINT:  Chief Complaint  Patient presents with   New Patient (Initial Visit)    Pt reports feeling okay. She started seeing double vision in June. Room 6 with husband.    HISTORY OF PRESENT ILLNESS:   71 year old female here for evaluation of double vision.  Patient was at home watching TV, then developed coughing spasm.  Patient opened her eyes she noticed double vision.  She had headache, felt off balance, and went to the hospital for evaluation.  She was noted to be significant hypertensive.  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  However MRI was negative for acute ischemia or demyelination.  Symptoms resolved within 2 days.  Stroke work-up was completed.   REVIEW OF SYSTEMS: Full 14 system review of systems performed and negative with exception of: As per HPI.  ALLERGIES: Allergies  Allergen Reactions   Oxycodone Palpitations    Raid heart rate    HOME MEDICATIONS: Outpatient Medications Prior to Visit  Medication Sig Dispense Refill   aspirin 81 MG EC tablet Take 1 tablet by mouth daily.     atorvastatin (LIPITOR) 20 MG tablet Take by mouth.     famotidine (PEPCID) 20 MG tablet Take 20 mg by mouth daily as needed.     gabapentin (NEURONTIN) 100 MG capsule Take 100 mg by mouth 2 (two) times daily.     letrozole (FEMARA) 2.5 MG tablet Take 2.5 mg by mouth daily.     losartan-hydrochlorothiazide (HYZAAR) 100-12.5 MG tablet Hold until followup with outpatient provider due to intermittent low blood pressure. 30 tablet 24   Vitamin D, Ergocalciferol, (DRISDOL) 1.25 MG (50000 UNIT) CAPS capsule Take 50,000 Units by mouth once a week.     cyclobenzaprine (FLEXERIL) 10 MG tablet Take 10 mg by mouth 2 (two) times daily  as needed. (Patient not taking: Reported on 07/17/2022)     docusate sodium (COLACE) 100 MG capsule Take 100 mg by mouth daily as needed. (Patient not taking: Reported on 07/17/2022)     No facility-administered medications prior to visit.    PAST MEDICAL HISTORY: Past Medical History:  Diagnosis Date   Breast cancer ([place]) 04/1997   right breast cancer, chemo and radiation   Hypertension    Personal history of chemotherapy    Personal history of radiation therapy     PAST SURGICAL HISTORY: Past Surgical History:  Procedure Laterality Date   BREAST BIOPSY Left 1998   negative   BREAST BIOPSY Right 1998   positive   BREAST LUMPECTOMY Right 1998   lumpectomy with chemo and rad tx    FAMILY HISTORY: Family History  Problem Relation Age of Onset   Breast cancer Neg Hx     SOCIAL HISTORY: Social History   Socioeconomic History   Marital status: Married    Spouse name: Not on file   Number of children: Not on file   Years of education: Not on file   Highest education level: Not on file  Occupational History   Not on file  Tobacco Use   Smoking status: Never   Smokeless tobacco: Never  Vaping Use   Vaping Use: Never used  Substance and Sexual Activity   Alcohol use: Yes   Drug use: Not Currently   Sexual activity: Not on file  Other Topics Concern   Not on file  Social History Narrative   Not on file   Social Determinants of Health   Financial Resource Strain: Not on file  Food Insecurity: Not on file  Transportation Needs: Not on file  Physical Activity: Not on file  Stress: Not on file  Social Connections: Not on file  Intimate Partner Violence: Not on file     PHYSICAL EXAM  GENERAL EXAM/CONSTITUTIONAL: Vitals:  Vitals:   07/17/22 1355  BP: 137/68  Pulse: 71  Weight: 218 lb (98.9 kg)  Height: '5\' 6"'$  (1.676 m)   Body mass index is 35.19 kg/m. Wt Readings from Last 3 Encounters:  07/17/22 218 lb (98.9 kg)  06/07/22 270 lb (122.5 kg)   02/26/21 207 lb 3.7 oz (94 kg)   Patient is in no distress; well developed, nourished and groomed; neck is supple  CARDIOVASCULAR: Examination of carotid arteries is normal; no carotid bruits Regular rate and rhythm, no murmurs Examination of peripheral vascular system by observation and palpation is normal  EYES: Ophthalmoscopic exam of optic discs and posterior segments is normal; no papilledema or hemorrhages No results found.  MUSCULOSKELETAL: Gait, strength, tone, movements noted in Neurologic exam below  NEUROLOGIC: MENTAL STATUS:      No data to display         awake, alert, oriented to person, place and time recent and remote memory intact normal attention and concentration language fluent, comprehension intact, naming intact fund of knowledge appropriate  CRANIAL NERVE:  2nd - no papilledema on fundoscopic exam 2nd, 3rd, 4th, 6th - pupils equal and reactive to light, visual fields full to confrontation, extraocular muscles intact, no nystagmus 5th - facial sensation symmetric 7th - facial strength symmetric 8th - hearing intact 9th - palate elevates symmetrically, uvula midline 11th - shoulder shrug symmetric 12th - tongue protrusion midline  MOTOR:  normal bulk and tone, full strength in the BUE, BLE  SENSORY:  normal and symmetric to light touch, temperature, vibration  COORDINATION:  finger-nose-finger, fine finger movements normal  REFLEXES:  deep tendon reflexes present and symmetric  GAIT/STATION:  narrow based gait     DIAGNOSTIC DATA (LABS, IMAGING, TESTING) - I reviewed patient records, labs, notes, testing and imaging myself where available.  Lab Results  Component Value Date   WBC 6.3 06/08/2022   HGB 12.8 06/08/2022   HCT 39.1 06/08/2022   MCV 96.1 06/08/2022   PLT 217 06/08/2022      Component Value Date/Time   NA 141 06/08/2022 0642   K 4.1 06/08/2022 0642   CL 109 06/08/2022 0642   CO2 27 06/08/2022 0642   GLUCOSE  113 (H) 06/08/2022 0642   BUN 22 06/08/2022 0642   CREATININE 0.79 06/08/2022 0642   CALCIUM 9.6 06/08/2022 0642   PROT 7.7 06/06/2022 2222   ALBUMIN 4.1 06/06/2022 2222   AST 21 06/06/2022 2222   ALT 16 06/06/2022 2222   ALKPHOS 101 06/06/2022 2222   BILITOT 0.6 06/06/2022 2222   GFRNONAA >60 06/08/2022 0642   Lab Results  Component Value Date   CHOL 145 06/07/2022   HDL 52 06/07/2022   LDLCALC 77 06/07/2022   TRIG 80 06/07/2022   CHOLHDL 2.8 06/07/2022   Lab Results  Component Value Date   HGBA1C 5.5 06/07/2022   No results found for: "VITAMINB12" No results found for: "TSH"  06/06/22 CTA head / neck 1. No emergent large vessel occlusion or high-grade stenosis of the intracranial or cervical arteries.  2. Aortic Atherosclerosis (ICD10-I70.0).  06/07/22 MRI brain 1. No acute intracranial abnormality. 2. Findings of chronic small vessel ischemia.  06/07/22 MRI brain with  No abnormal contrast enhancement.  06/07/22  TTE 1. Left ventricular ejection fraction, by estimation, is 55 to 60%. The  left ventricle has normal function. The left ventricle has no regional  wall motion abnormalities. There is mild concentric left ventricular  hypertrophy. Left ventricular diastolic  parameters are consistent with Grade I diastolic dysfunction (impaired  relaxation).   2. Right ventricular systolic function is mildly reduced. The right  ventricular size is moderately enlarged.   3. Left atrial size was moderately dilated.   4. Right atrial size was moderately dilated.   5. The mitral valve is normal in structure. Trivial mitral valve  regurgitation. No evidence of mitral stenosis.   6. The aortic valve is normal in structure. Aortic valve regurgitation is  mild. Aortic valve sclerosis is present, with no evidence of aortic valve  stenosis.   7. The inferior vena cava is normal in size with greater than 50%  respiratory variability, suggesting right atrial pressure of 3 mmHg.     ASSESSMENT AND PLAN  71 y.o. year old female here with 2 days of double vision, associated with headaches and hypertension.  Resolved.   Dx:  1. Double vision with both eyes open       PLAN:  DOUBLE VISION, HEADACHES, DIZZINESS, HYPERTENSION (possible MRI negative brainstem stroke) - continue aspirin '81mg'$ , statin, BP control - consider sleep study (stroke, HTN risk factors; OSA screening) - check neuromuscular labs  Orders Placed This Encounter  Procedures   AChR Abs with Reflex to MuSK   CK   Aldolase   Ambulatory referral to Sleep Studies   Return for pending if symptoms worsen or fail to improve, pending test results.    Penni Bombard, MD 7/41/6384, 5:36 PM Certified in Neurology, Neurophysiology and Neuroimaging  [facility] [address] [place], [place] [address] [phone]

## 2022-07-27 LAB — CK: Total CK: 49 U/L (ref 32–182)

## 2022-07-27 LAB — ALDOLASE: Aldolase: 4.8 U/L (ref 3.3–10.3)

## 2022-07-27 LAB — MUSK ANTIBODIES: MuSK Antibodies: <1 U/mL

## 2022-07-27 LAB — ACHR ABS WITH REFLEX TO MUSK: AChR Binding Ab, Serum: <0.03 nmol/L (ref 0.00–0.24)

## 2022-08-06 ENCOUNTER — Telehealth: Payer: Self-pay

## 2022-08-06 NOTE — Telephone Encounter (Signed)
-----   Message from Penni Bombard, MD sent at 08/06/2022  4:43 PM EDT ----- Normal labs. Please call patient. -VRP

## 2022-08-06 NOTE — Telephone Encounter (Signed)
Contacted pt, LMV per DPR, informing her of labs. Number provided to CB with questions.

## 2022-09-08 ENCOUNTER — Encounter: Payer: Self-pay | Admitting: Ophthalmology

## 2022-09-09 NOTE — Discharge Instructions (Signed)

## 2022-09-11 ENCOUNTER — Ambulatory Visit (AMBULATORY_SURGERY_CENTER): Payer: Medicare Other | Admitting: General Practice

## 2022-09-11 ENCOUNTER — Ambulatory Visit: Payer: Medicare Other | Admitting: General Practice

## 2022-09-11 ENCOUNTER — Ambulatory Visit
Admission: RE | Admit: 2022-09-11 | Discharge: 2022-09-11 | Disposition: A | Discharge status: Home / Self Care | Payer: Medicare Other | Attending: Ophthalmology | Admitting: Ophthalmology

## 2022-09-11 ENCOUNTER — Encounter: Payer: Self-pay | Admitting: Ophthalmology

## 2022-09-11 ENCOUNTER — Encounter
Admission: RE | Disposition: A | Discharge status: Home / Self Care | Payer: Self-pay | Source: Home / Self Care | Attending: Ophthalmology

## 2022-09-11 ENCOUNTER — Other Ambulatory Visit: Payer: Self-pay

## 2022-09-11 DIAGNOSIS — H2512 Age-related nuclear cataract, left eye: Secondary | ICD-10-CM

## 2022-09-11 DIAGNOSIS — E785 Hyperlipidemia, unspecified: Secondary | ICD-10-CM | POA: Insufficient documentation

## 2022-09-11 DIAGNOSIS — I10 Essential (primary) hypertension: Secondary | ICD-10-CM

## 2022-09-11 DIAGNOSIS — Z853 Personal history of malignant neoplasm of breast: Secondary | ICD-10-CM | POA: Diagnosis not present

## 2022-09-11 DIAGNOSIS — Z923 Personal history of irradiation: Secondary | ICD-10-CM | POA: Diagnosis not present

## 2022-09-11 DIAGNOSIS — Z9221 Personal history of antineoplastic chemotherapy: Secondary | ICD-10-CM | POA: Diagnosis not present

## 2022-09-11 DIAGNOSIS — K219 Gastro-esophageal reflux disease without esophagitis: Secondary | ICD-10-CM | POA: Diagnosis not present

## 2022-09-11 DIAGNOSIS — Z87891 Personal history of nicotine dependence: Secondary | ICD-10-CM

## 2022-09-11 HISTORY — PX: CATARACT EXTRACTION W/PHACO: SHX586

## 2022-09-11 HISTORY — DX: Gastro-esophageal reflux disease without esophagitis: K21.9

## 2022-09-11 HISTORY — DX: Hyperlipidemia, unspecified: E78.5

## 2022-09-11 SURGERY — PHACOEMULSIFICATION, CATARACT, WITH IOL INSERTION
Anesthesia: Monitor Anesthesia Care | Site: Eye | Laterality: Left

## 2022-09-11 MED ORDER — MOXIFLOXACIN HCL 0.5 % OP SOLN
OPHTHALMIC | Status: DC | PRN
  Administered 2022-09-11: 0.2 mL via OPHTHALMIC

## 2022-09-11 MED ORDER — SIGHTPATH DOSE#1 NA HYALUR & NA CHOND-NA HYALUR IO KIT
PACK | INTRAOCULAR | Status: DC | PRN
  Administered 2022-09-11: 1 via OPHTHALMIC

## 2022-09-11 MED ORDER — TETRACAINE HCL 0.5 % OP SOLN
1.0000 [drp] | OPHTHALMIC | Status: DC | PRN
  Administered 2022-09-11 (×3): 1 [drp] via OPHTHALMIC

## 2022-09-11 MED ORDER — MIDAZOLAM HCL 2 MG/2ML IJ SOLN
INTRAMUSCULAR | Status: DC | PRN
  Administered 2022-09-11: 1 mg via INTRAVENOUS
  Administered 2022-09-11: 2 mg via INTRAVENOUS
  Administered 2022-09-11: 1 mg via INTRAVENOUS

## 2022-09-11 MED ORDER — SIGHTPATH DOSE#1 BSS IO SOLN
INTRAOCULAR | Status: DC | PRN
  Administered 2022-09-11: 15 mL

## 2022-09-11 MED ORDER — TETRACAINE 0.5 % OP SOLN OPTIME - NO CHARGE
OPHTHALMIC | Status: DC | PRN
  Administered 2022-09-11: 1 [drp] via OPHTHALMIC

## 2022-09-11 MED ORDER — ARMC OPHTHALMIC DILATING DROPS
1.0000 | OPHTHALMIC | Status: DC | PRN
  Administered 2022-09-11 (×3): 1 via OPHTHALMIC

## 2022-09-11 MED ORDER — SIGHTPATH DOSE#1 BSS IO SOLN
INTRAOCULAR | Status: DC | PRN
  Administered 2022-09-11: 96 mL via OPHTHALMIC

## 2022-09-11 MED ORDER — LACTATED RINGERS IV SOLN: INTRAVENOUS | Status: DC

## 2022-09-11 MED ORDER — FENTANYL CITRATE (PF) 100 MCG/2ML IJ SOLN
INTRAMUSCULAR | Status: DC | PRN
  Administered 2022-09-11 (×2): 50 ug via INTRAVENOUS

## 2022-09-11 MED ORDER — TRYPAN BLUE 0.06 % IO SOSY
PREFILLED_SYRINGE | INTRAOCULAR | Status: DC | PRN
  Administered 2022-09-11: .2 mL via INTRAOCULAR

## 2022-09-11 MED ORDER — LIDOCAINE HCL (PF) 2 % IJ SOLN
INTRAOCULAR | Status: DC | PRN
  Administered 2022-09-11: 1 mL via INTRAOCULAR

## 2022-09-11 MED ORDER — BRIMONIDINE TARTRATE-TIMOLOL 0.2-0.5 % OP SOLN
OPHTHALMIC | Status: DC | PRN
  Administered 2022-09-11: 1 [drp] via OPHTHALMIC

## 2022-09-11 SURGICAL SUPPLY — 15 items
CANNULA ANT/CHMB 27G (MISCELLANEOUS) IMPLANT
CANNULA ANT/CHMB 27GA (MISCELLANEOUS) ×1 IMPLANT
CATARACT SUITE SIGHTPATH (MISCELLANEOUS) ×1 IMPLANT
DISSECTOR HYDRO NUCLEUS 50X22 (MISCELLANEOUS) ×1 IMPLANT
DRSG TEGADERM 2-3/8X2-3/4 SM (GAUZE/BANDAGES/DRESSINGS) ×1 IMPLANT
FEE CATARACT SUITE SIGHTPATH (MISCELLANEOUS) ×1 IMPLANT
GLOVE SURG SYN 7.5  E (GLOVE) ×1
GLOVE SURG SYN 7.5 E (GLOVE) ×1 IMPLANT
GLOVE SURG SYN 7.5 PF PI (GLOVE) ×1 IMPLANT
GLOVE SURG SYN 8.5  E (GLOVE) ×1
GLOVE SURG SYN 8.5 E (GLOVE) ×1 IMPLANT
GLOVE SURG SYN 8.5 PF PI (GLOVE) ×1 IMPLANT
LENS IOL ACRSF IQ VT 20.0 IMPLANT
LENS VIVITY YLLW DAT015 20.0 ×1 IMPLANT
WATER STERILE IRR 250ML POUR (IV SOLUTION) ×1 IMPLANT

## 2022-09-11 NOTE — Anesthesia Postprocedure Evaluation (Signed)
Anesthesia Post Note  Patient: Pamela Trevino  Procedure(s) Performed: CATARACT EXTRACTION PHACO AND INTRAOCULAR LENS PLACEMENT (IOC) LEFT 16.31 01:26.0 (Left: Eye)     Patient location during evaluation: PACU Anesthesia Type: MAC Level of consciousness: awake and alert Pain management: pain level controlled Vital Signs Assessment: post-procedure vital signs reviewed and stable Respiratory status: spontaneous breathing, nonlabored ventilation, respiratory function stable and patient connected to nasal cannula oxygen Cardiovascular status: stable and blood pressure returned to baseline Postop Assessment: no apparent nausea or vomiting Anesthetic complications: no   No notable events documented.  Martha Clan

## 2022-09-11 NOTE — Transfer of Care (Signed)
Immediate Anesthesia Transfer of Care Note  Patient: Pamela Trevino  Procedure(s) Performed: CATARACT EXTRACTION PHACO AND INTRAOCULAR LENS PLACEMENT (IOC) LEFT 16.31 01:26.0 (Left: Eye)  Patient Location: PACU  Anesthesia Type: MAC  Level of Consciousness: awake, alert  and patient cooperative  Airway and Oxygen Therapy: Patient Spontanous Breathing and Patient connected to supplemental oxygen  Post-op Assessment: Post-op Vital signs reviewed, Patient's Cardiovascular Status Stable, Respiratory Function Stable, Patent Airway and No signs of Nausea or vomiting  Post-op Vital Signs: Reviewed and stable  Complications: No notable events documented.

## 2022-09-11 NOTE — Op Note (Signed)
OPERATIVE NOTE  Pamela Trevino [id number] 09/11/2022   PREOPERATIVE DIAGNOSIS: Nuclear sclerotic cataract left eye. H25.12   POSTOPERATIVE DIAGNOSIS: Nuclear sclerotic cataract left eye. H25.12   PROCEDURE:  Phacoemusification with posterior chamber intraocular lens placement of the left eye  Ultrasound time: Procedure(s): CATARACT EXTRACTION PHACO AND INTRAOCULAR LENS PLACEMENT (IOC) LEFT 16.31 01:26.0 (Left)  LENS:   Implant Name Type Inv. Item Serial No. Manufacturer Lot No. LRB No. Used Action  LENS VIVITY YLLW DAT015 20.0 - [id number]  LENS VIVITY YLLW DAT015 20.0 [id number] [employer]  Left 1 Implanted      SURGEON:  Courtney Heys. Lazarus Salines, MD   ANESTHESIA:  Topical with tetracaine drops, augmented with 1% preservative-free intracameral lidocaine.   COMPLICATIONS:  None.   DESCRIPTION OF PROCEDURE:  The patient was identified in the holding room and transported to the operating room and placed in the supine position under the operating microscope.  The left eye was identified as the operative eye, which was prepped and draped in the usual sterile ophthalmic fashion.   A 1 millimeter clear-corneal paracentesis was made inferotemporally. Preservative-free 1% lidocaine mixed with 1:1,000 bisulfite-free aqueous solution of epinephrine was injected into the anterior chamber. The anterior chamber was then filled with Viscoat viscoelastic. A 2.4 millimeter keratome was used to make a clear-corneal incision superotemporally. 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 Vision blue was injected intracamerally to facilitate safe creation of the capsulorhexis as well as nuclear fragmentation and removal. A curvilinear capsulorrhexis was made with a cystotome and capsulorrhexis forceps. Balanced salt solution was used to hydrodissect and hydrodelineate the nucleus. Phacoemulsification was then used to remove the lens  nucleus and epinucleus. The remaining cortex was then removed using the irrigation and aspiration handpiece. Provisc was then placed into the capsular bag to distend it for lens placement. A +20.00 DAT015 intraocular lens was then injected into the capsular bag. The remaining viscoelastic was aspirated.   Wounds were hydrated with balanced salt solution.  The anterior chamber was inflated to a physiologic pressure with balanced salt solution.  No wound leaks were noted. Vigamox was injected intracamerally.  Timolol and Brimonidine drops were applied to the eye.  The patient was taken to the recovery room in stable condition without complications of anesthesia or surgery.  [name] [place] 09/11/2022, 8:59 AM

## 2022-09-11 NOTE — H&P (Signed)
[facility]   Primary Care Physician:  Ulyess Blossom, [place] Ophthalmologist: Dr. Merleen Nicely  Pre-Procedure History & Physical: HPI:  Pamela Trevino is a 71 y.o. female here for cataract surgery.   Past Medical History:  Diagnosis Date   Breast cancer ([place]) 04/1997   right breast cancer, chemo and radiation   GERD (gastroesophageal reflux disease)    Hyperlipidemia    Hypertension    Personal history of chemotherapy    Personal history of radiation therapy     Past Surgical History:  Procedure Laterality Date   BREAST BIOPSY Left 1998   negative   BREAST BIOPSY Right 1998   positive   BREAST LUMPECTOMY Right 1998   lumpectomy with chemo and rad tx    Prior to Admission medications   Medication Sig Start Date End Date Taking? Authorizing Provider  aspirin 81 MG EC tablet Take 1 tablet by mouth daily. 11/12/20  Yes [provider]  atorvastatin (LIPITOR) 20 MG tablet Take by mouth. 12/07/20  Yes [provider]  diclofenac (VOLTAREN) 75 MG EC tablet Take 75 mg by mouth 2 (two) times daily as needed.   Yes [provider]  letrozole (FEMARA) 2.5 MG tablet Take 2.5 mg by mouth daily. 03/29/22  Yes [provider]  losartan-hydrochlorothiazide (HYZAAR) 100-12.5 MG tablet Hold until followup with outpatient provider due to intermittent low blood pressure. 06/08/22  Yes Enzo Bi, MD  omeprazole (PRILOSEC) 40 MG capsule Take 40 mg by mouth daily.   Yes [provider]  Vitamin D, Ergocalciferol, (DRISDOL) 1.25 MG (50000 UNIT) CAPS capsule Take 50,000 Units by mouth once a week. 12/03/20  Yes [provider]  cyclobenzaprine (FLEXERIL) 10 MG tablet Take 10 mg by mouth 2 (two) times daily as needed. Patient not taking: Reported on 07/17/2022 04/14/22   [provider]  docusate sodium (COLACE) 100 MG capsule Take 100 mg by mouth daily as needed. Patient not taking: Reported on 07/17/2022    [provider]   gabapentin (NEURONTIN) 100 MG capsule Take 100 mg by mouth 2 (two) times daily. Patient not taking: Reported on 09/08/2022 04/14/22   [provider]    Allergies as of 07/02/2022 - Review Complete 06/07/2022  Allergen Reaction Noted   Oxycodone Palpitations 04/18/2013    Family History  Problem Relation Age of Onset   Breast cancer Neg Hx     Social History   Socioeconomic History   Marital status: Married    Spouse name: Not on file   Number of children: Not on file   Years of education: Not on file   Highest education level: Not on file  Occupational History   Not on file  Tobacco Use   Smoking status: Former    Types: Cigarettes    Quit date: 84    Years since quitting: 48.7   Smokeless tobacco: Never  Vaping Use   Vaping Use: Never used  Substance and Sexual Activity   Alcohol use: Yes    Comment: Rare   Drug use: Not Currently   Sexual activity: Not on file  Other Topics Concern   Not on file  Social History Narrative   Not on file   Social Determinants of Health   Financial Resource Strain: Not on file  Food Insecurity: Not on file  Transportation Needs: Not on file  Physical Activity: Not on file  Stress: Not on file  Social Connections: Not on file  Intimate Partner Violence: Not on  file    Review of Systems: See HPI, otherwise negative ROS  Physical Exam: BP (!) 175/71   Pulse 71   Temp (!) 97 F (36.1 C) (Temporal)   Resp (!) 21   Ht '5\' 6"'$  (1.676 m)   Wt 98.9 kg   SpO2 100%   BMI 35.19 kg/m  General:   Alert, cooperative in NAD Head:  Normocephalic and atraumatic. Respiratory:  Normal work of breathing. Cardiovascular:  RRR  Impression/Plan: Pamela Trevino is here for cataract surgery.  Risks, benefits, limitations, and alternatives regarding cataract surgery have been reviewed with the patient.  Questions have been answered.  All parties agreeable.   Norvel Richards, MD  09/11/2022, 7:08 AM

## 2022-09-11 NOTE — Anesthesia Preprocedure Evaluation (Signed)
Anesthesia Evaluation  Patient identified by MRN, date of birth, ID band Patient awake    Reviewed: Allergy & Precautions, H&P , NPO status , Patient's Chart, lab work & pertinent test results, reviewed documented beta blocker date and time   History of Anesthesia Complications Negative for: history of anesthetic complications  Airway Mallampati: II  TM Distance: >3 FB Neck ROM: full    Dental  (+) Caps, Dental Advidsory Given, Teeth Intact   Pulmonary neg pulmonary ROS, former smoker,    Pulmonary exam normal breath sounds clear to auscultation       Cardiovascular Exercise Tolerance: Good hypertension, (-) angina(-) Past MI and (-) Cardiac Stents Normal cardiovascular exam(-) dysrhythmias (-) Valvular Problems/Murmurs Rhythm:regular Rate:Normal     Neuro/Psych negative neurological ROS  negative psych ROS   GI/Hepatic Neg liver ROS, GERD  ,  Endo/Other  negative endocrine ROS  Renal/GU negative Renal ROS  negative genitourinary   Musculoskeletal   Abdominal   Peds  Hematology negative hematology ROS (+)   Anesthesia Other Findings Past Medical History: 04/1997: Breast cancer ([place])     Comment:  right breast cancer, chemo and radiation No date: GERD (gastroesophageal reflux disease) No date: Hyperlipidemia No date: Hypertension No date: Personal history of chemotherapy No date: Personal history of radiation therapy   Reproductive/Obstetrics negative OB ROS                             Anesthesia Physical Anesthesia Plan  ASA: 2  Anesthesia Plan: MAC   Post-op Pain Management:    Induction: Intravenous  PONV Risk Score and Plan: 2 and Midazolam and Treatment may vary due to age or medical condition  Airway Management Planned: Natural Airway and Nasal Cannula  Additional Equipment:   Intra-op Plan:   Post-operative Plan:   Informed Consent: I have reviewed the patients  History and Physical, chart, labs and discussed the procedure including the risks, benefits and alternatives for the proposed anesthesia with the patient or authorized representative who has indicated his/her understanding and acceptance.     Dental Advisory Given  Plan Discussed with: Anesthesiologist, CRNA and Surgeon  Anesthesia Plan Comments:         Anesthesia Quick Evaluation

## 2022-09-12 ENCOUNTER — Other Ambulatory Visit: Payer: Self-pay

## 2022-09-12 ENCOUNTER — Encounter: Payer: Self-pay | Admitting: Ophthalmology

## 2022-09-24 NOTE — Discharge Instructions (Signed)

## 2022-09-25 ENCOUNTER — Encounter: Payer: Self-pay | Admitting: Ophthalmology

## 2022-09-25 ENCOUNTER — Ambulatory Visit
Admission: RE | Admit: 2022-09-25 | Discharge: 2022-09-25 | Disposition: A | Discharge status: Home / Self Care | Payer: Medicare Other | Attending: Ophthalmology | Admitting: Ophthalmology

## 2022-09-25 ENCOUNTER — Ambulatory Visit (AMBULATORY_SURGERY_CENTER): Payer: Medicare Other | Admitting: Anesthesiology

## 2022-09-25 ENCOUNTER — Other Ambulatory Visit: Payer: Self-pay

## 2022-09-25 ENCOUNTER — Ambulatory Visit: Payer: Medicare Other | Admitting: Anesthesiology

## 2022-09-25 ENCOUNTER — Encounter
Admission: RE | Disposition: A | Discharge status: Home / Self Care | Payer: Self-pay | Source: Home / Self Care | Attending: Ophthalmology

## 2022-09-25 DIAGNOSIS — Z87891 Personal history of nicotine dependence: Secondary | ICD-10-CM | POA: Diagnosis not present

## 2022-09-25 DIAGNOSIS — K219 Gastro-esophageal reflux disease without esophagitis: Secondary | ICD-10-CM | POA: Diagnosis not present

## 2022-09-25 DIAGNOSIS — Z853 Personal history of malignant neoplasm of breast: Secondary | ICD-10-CM | POA: Insufficient documentation

## 2022-09-25 DIAGNOSIS — I10 Essential (primary) hypertension: Secondary | ICD-10-CM

## 2022-09-25 DIAGNOSIS — Z9221 Personal history of antineoplastic chemotherapy: Secondary | ICD-10-CM | POA: Insufficient documentation

## 2022-09-25 DIAGNOSIS — H2511 Age-related nuclear cataract, right eye: Secondary | ICD-10-CM | POA: Insufficient documentation

## 2022-09-25 DIAGNOSIS — Z923 Personal history of irradiation: Secondary | ICD-10-CM | POA: Diagnosis not present

## 2022-09-25 DIAGNOSIS — E785 Hyperlipidemia, unspecified: Secondary | ICD-10-CM | POA: Diagnosis not present

## 2022-09-25 HISTORY — PX: CATARACT EXTRACTION W/PHACO: SHX586

## 2022-09-25 SURGERY — PHACOEMULSIFICATION, CATARACT, WITH IOL INSERTION
Anesthesia: Monitor Anesthesia Care | Site: Eye | Laterality: Right

## 2022-09-25 MED ORDER — MOXIFLOXACIN HCL 0.5 % OP SOLN
OPHTHALMIC | Status: DC | PRN
  Administered 2022-09-25: 0.2 mL via OPHTHALMIC

## 2022-09-25 MED ORDER — SIGHTPATH DOSE#1 NA HYALUR & NA CHOND-NA HYALUR IO KIT
PACK | INTRAOCULAR | Status: DC | PRN
  Administered 2022-09-25: 1 via OPHTHALMIC

## 2022-09-25 MED ORDER — TRYPAN BLUE 0.06 % IO SOSY
PREFILLED_SYRINGE | INTRAOCULAR | Status: DC | PRN
  Administered 2022-09-25: .2 mL via INTRAOCULAR

## 2022-09-25 MED ORDER — BRIMONIDINE TARTRATE-TIMOLOL 0.2-0.5 % OP SOLN
OPHTHALMIC | Status: DC | PRN
  Administered 2022-09-25: 1 [drp] via OPHTHALMIC

## 2022-09-25 MED ORDER — LIDOCAINE HCL (PF) 2 % IJ SOLN
INTRAOCULAR | Status: DC | PRN
  Administered 2022-09-25: 1 mL via INTRAOCULAR

## 2022-09-25 MED ORDER — ONDANSETRON HCL 4 MG/2ML IJ SOLN: 4.0000 mg | Freq: Once | INTRAMUSCULAR | Status: DC | PRN

## 2022-09-25 MED ORDER — TETRACAINE HCL 0.5 % OP SOLN
1.0000 [drp] | OPHTHALMIC | Status: DC | PRN
  Administered 2022-09-25 (×3): 1 [drp] via OPHTHALMIC

## 2022-09-25 MED ORDER — NA HYALUR & NA CHOND-NA HYALUR 0.55-0.5 ML IO KIT
PACK | INTRAOCULAR | Status: DC | PRN
  Administered 2022-09-25: 1 via OPHTHALMIC

## 2022-09-25 MED ORDER — ONDANSETRON HCL 4 MG/2ML IJ SOLN
INTRAMUSCULAR | Status: DC | PRN
  Administered 2022-09-25: 4 mg via INTRAVENOUS

## 2022-09-25 MED ORDER — SIGHTPATH DOSE#1 BSS IO SOLN
INTRAOCULAR | Status: DC | PRN
  Administered 2022-09-25 (×2): 15 mL

## 2022-09-25 MED ORDER — LACTATED RINGERS IV SOLN: INTRAVENOUS | Status: DC

## 2022-09-25 MED ORDER — FENTANYL CITRATE (PF) 100 MCG/2ML IJ SOLN
INTRAMUSCULAR | Status: DC | PRN
  Administered 2022-09-25: 25 ug via INTRAVENOUS
  Administered 2022-09-25: 50 ug via INTRAVENOUS
  Administered 2022-09-25: 25 ug via INTRAVENOUS

## 2022-09-25 MED ORDER — MIDAZOLAM HCL 2 MG/2ML IJ SOLN
INTRAMUSCULAR | Status: DC | PRN
  Administered 2022-09-25: 1 mg via INTRAVENOUS
  Administered 2022-09-25 (×2): .5 mg via INTRAVENOUS

## 2022-09-25 MED ORDER — ARMC OPHTHALMIC DILATING DROPS
1.0000 | OPHTHALMIC | Status: DC | PRN
  Administered 2022-09-25 (×3): 1 via OPHTHALMIC

## 2022-09-25 MED ORDER — SIGHTPATH DOSE#1 BSS IO SOLN
INTRAOCULAR | Status: DC | PRN
  Administered 2022-09-25: 88 mL via OPHTHALMIC

## 2022-09-25 MED ORDER — TETRACAINE 0.5 % OP SOLN OPTIME - NO CHARGE
OPHTHALMIC | Status: DC | PRN
  Administered 2022-09-25: 1 [drp] via OPHTHALMIC

## 2022-09-25 SURGICAL SUPPLY — 16 items
CANNULA ANT/CHMB 27G (MISCELLANEOUS) IMPLANT
CANNULA ANT/CHMB 27GA (MISCELLANEOUS) ×1 IMPLANT
CATARACT SUITE SIGHTPATH (MISCELLANEOUS) ×1 IMPLANT
DISSECTOR HYDRO NUCLEUS 50X22 (MISCELLANEOUS) ×1 IMPLANT
DRSG TEGADERM 2-3/8X2-3/4 SM (GAUZE/BANDAGES/DRESSINGS) ×1 IMPLANT
FEE CATARACT SUITE SIGHTPATH (MISCELLANEOUS) ×1 IMPLANT
GLOVE SURG SYN 7.5  E (GLOVE) ×1
GLOVE SURG SYN 7.5 E (GLOVE) ×1 IMPLANT
GLOVE SURG SYN 7.5 PF PI (GLOVE) ×1 IMPLANT
GLOVE SURG SYN 8.5  E (GLOVE) ×1
GLOVE SURG SYN 8.5 E (GLOVE) ×1 IMPLANT
GLOVE SURG SYN 8.5 PF PI (GLOVE) ×1 IMPLANT
LENS IOL EYHANCE TORIC II 19.5 ×1 IMPLANT
LENS IOL EYHANCE TRC 225 19.5 IMPLANT
LENS IOL EYHNC TORIC 225 19.5 ×1 IMPLANT
WATER STERILE IRR 250ML POUR (IV SOLUTION) ×1 IMPLANT

## 2022-09-25 NOTE — Op Note (Signed)
OPERATIVE NOTE  Pamela Trevino [id number] 09/25/2022   PREOPERATIVE DIAGNOSIS: Nuclear sclerotic cataract right eye. H25.11   POSTOPERATIVE DIAGNOSIS: Nuclear sclerotic cataract right eye. H25.11   PROCEDURE:  Phacoemusification with Toric posterior chamber intraocular lens placement of the right eye  Ultrasound time: Procedure(s): CATARACT EXTRACTION PHACO AND INTRAOCULAR LENS PLACEMENT (IOC) RIGHT  EYEHANCE TORIC LENS 18.49 01:37.8 (Right)  LENS:   Implant Name Type Inv. Item Serial No. Manufacturer Lot No. LRB No. Used Action  LENS IOL EYHANCE TORIC II 19.5 - [id number]  LENS IOL EYHANCE TORIC II 19.5 [id number] [employer]  Right 1 Implanted    [id number] Toric intraocular lens with 2.25 diopters of cylindrical power with axis orientation at 31 degrees.  SURGEON:  Courtney Heys. Lazarus Salines, MD   ANESTHESIA:  Topical with tetracaine drops, augmented with 1% preservative-free intracameral lidocaine.   COMPLICATIONS:  None.   DESCRIPTION OF PROCEDURE:  The patient was identified in the holding room and transported to the operating room and placed in the supine position under the operating microscope.  The right eye was identified as the operative eye, which was prepped and draped in the usual sterile ophthalmic fashion.   A 1 millimeter clear-corneal paracentesis was made superotemporally. Preservative-free 1% lidocaine mixed with 1:1,000 bisulfite-free aqueous solution of epinephrine was injected into the anterior chamber. Due to dense corneal haze secondary to shagreen, Trypan blue was injected to stain the anterior capsule and facilitate safe creation of the capsulorrhexis. The anterior chamber was then filled with Viscoat viscoelastic. A 2.4 millimeter keratome was used to make a clear-corneal incision inferotemporally. A curvilinear capsulorrhexis was made with a cystotome and capsulorrhexis forceps. Balanced salt solution was used to hydrodissect and hydrodelineate the nucleus. Phacoemulsification  was then used to remove the lens nucleus and epinucleus. The remaining cortex was then removed using the irrigation and aspiration handpiece. Provisc was then placed into the capsular bag to distend it for lens placement. The Verion digital marker was used to align the implant at the intended axis.  A +19.50 D [id number] Toric lens was then injected into the capsular bag.  It was rotated clockwise until the axis marks on the lens were approximately 15 degrees in the counterclockwise direction to the intended alignment.  The viscoelastic was aspirated from the eye using the irrigation aspiration handpiece.  Then, a blunt chopper through the sideport incision was used to rotate the lens in a clockwise direction until the axis markings of the intraocular lens were lined up with the Verion alignment.  Balanced salt solution was then used to hydrate the wounds.   The anterior chamber was inflated to a physiologic pressure with balanced salt solution.  No wound leaks were noted. Vigamox was injected intracamerally.  Timolol and Brimonidine drops were applied to the eye.  The patient was taken to the recovery room in stable condition without complications of anesthesia or surgery.  [name] [place] 09/25/2022, 12:41 PM

## 2022-09-25 NOTE — Transfer of Care (Signed)
Immediate Anesthesia Transfer of Care Note  Patient: Pamela Trevino  Procedure(s) Performed: CATARACT EXTRACTION PHACO AND INTRAOCULAR LENS PLACEMENT (IOC) RIGHT  EYEHANCE TORIC LENS 18.49 01:37.8 (Right: Eye)  Patient Location: PACU  Anesthesia Type:MAC  Level of Consciousness: awake, alert  and oriented  Airway & Oxygen Therapy: Patient Spontanous Breathing  Post-op Assessment: Report given to RN and Post -op Vital signs reviewed and stable  Post vital signs: Reviewed and stable  Last Vitals:  Vitals Value Taken Time  BP 118/97 09/25/22 1243  Temp    Pulse 62 09/25/22 1244  Resp 18 09/25/22 1244  SpO2 100 % 09/25/22 1244  Vitals shown include unvalidated device data.  Last Pain:  Vitals:   09/25/22 1041  TempSrc: Tympanic  PainSc: 0-No pain         Complications: There were no known notable events for this encounter.

## 2022-09-25 NOTE — H&P (Signed)
[facility]   Primary Care Physician:  Ulyess Blossom, [place] Ophthalmologist: Dr. Merleen Nicely  Pre-Procedure History & Physical: HPI:  Pamela Trevino is a 71 y.o. female here for cataract surgery.   Past Medical History:  Diagnosis Date   Breast cancer ([place]) 04/1997   right breast cancer, chemo and radiation   GERD (gastroesophageal reflux disease)    Hyperlipidemia    Hypertension    Personal history of chemotherapy    Personal history of radiation therapy     Past Surgical History:  Procedure Laterality Date   BREAST BIOPSY Left 1998   negative   BREAST BIOPSY Right 1998   positive   BREAST LUMPECTOMY Right 1998   lumpectomy with chemo and rad tx   CATARACT EXTRACTION W/PHACO Left 09/11/2022   Procedure: CATARACT EXTRACTION PHACO AND INTRAOCULAR LENS PLACEMENT ([place]) LEFT 16.31 01:26.0;  Surgeon: Norvel Richards, MD;  Location: [place];  Service: Ophthalmology;  Laterality: Left;    Prior to Admission medications   Medication Sig Start Date End Date Taking? Authorizing Provider  aspirin 81 MG EC tablet Take 1 tablet by mouth daily. 11/12/20  Yes [provider]  atorvastatin (LIPITOR) 20 MG tablet Take by mouth. 12/07/20  Yes [provider]  diclofenac (VOLTAREN) 75 MG EC tablet Take 75 mg by mouth 2 (two) times daily as needed.   Yes [provider]  letrozole (FEMARA) 2.5 MG tablet Take 2.5 mg by mouth daily. 03/29/22  Yes [provider]  losartan-hydrochlorothiazide (HYZAAR) 100-12.5 MG tablet Hold until followup with outpatient provider due to intermittent low blood pressure. 06/08/22  Yes Enzo Bi, MD  omeprazole (PRILOSEC) 40 MG capsule Take 40 mg by mouth daily.   Yes [provider]  Vitamin D, Ergocalciferol, (DRISDOL) 1.25 MG (50000 UNIT) CAPS capsule Take 50,000 Units by mouth once a week. 12/03/20  Yes [provider]  cyclobenzaprine (FLEXERIL) 10 MG tablet Take 10 mg by mouth 2  (two) times daily as needed. Patient not taking: Reported on 07/17/2022 04/14/22   [provider]  docusate sodium (COLACE) 100 MG capsule Take 100 mg by mouth daily as needed. Patient not taking: Reported on 07/17/2022    [provider]  gabapentin (NEURONTIN) 100 MG capsule Take 100 mg by mouth 2 (two) times daily. Patient not taking: Reported on 09/08/2022 04/14/22   [provider]    Allergies as of 07/02/2022 - Review Complete 06/07/2022  Allergen Reaction Noted   Oxycodone Palpitations 04/18/2013    Family History  Problem Relation Age of Onset   Breast cancer Neg Hx     Social History   Socioeconomic History   Marital status: Married    Spouse name: Not on file   Number of children: Not on file   Years of education: Not on file   Highest education level: Not on file  Occupational History   Not on file  Tobacco Use   Smoking status: Former    Types: Cigarettes    Quit date: 1975    Years since quitting: 48.7   Smokeless tobacco: Never  Vaping Use   Vaping Use: Never used  Substance and Sexual Activity   Alcohol use: Yes    Comment: Rare   Drug use: Not Currently   Sexual activity: Not on file  Other Topics Concern   Not on file  Social History Narrative   Not on file   Social Determinants of Health   Financial Resource Strain:  Not on file  Food Insecurity: Not on file  Transportation Needs: Not on file  Physical Activity: Not on file  Stress: Not on file  Social Connections: Not on file  Intimate Partner Violence: Not on file    Review of Systems: See HPI, otherwise negative ROS  Physical Exam: BP (!) 159/71   Temp 97.9 F (36.6 C) (Tympanic)   Ht 5' 5.98" (1.676 m)   Wt 99 kg   SpO2 96%   BMI 35.25 kg/m  General:   Alert, cooperative in NAD Head:  Normocephalic and atraumatic. Respiratory:  Normal work of breathing. Cardiovascular:  RRR  Impression/Plan: Pamela Trevino is here for cataract surgery.  Risks,  benefits, limitations, and alternatives regarding cataract surgery have been reviewed with the patient.  Questions have been answered.  All parties agreeable.   Norvel Richards, MD  09/25/2022, 11:39 AM

## 2022-09-25 NOTE — Anesthesia Postprocedure Evaluation (Signed)
Anesthesia Post Note  Patient: Pamela Trevino  Procedure(s) Performed: CATARACT EXTRACTION PHACO AND INTRAOCULAR LENS PLACEMENT ([facility]) RIGHT  EYEHANCE TORIC LENS 18.49 01:37.8 (Right: Eye)     Patient location during evaluation: PACU Anesthesia Type: MAC Level of consciousness: awake and alert Pain management: pain level controlled Vital Signs Assessment: post-procedure vital signs reviewed and stable Respiratory status: spontaneous breathing, nonlabored ventilation, respiratory function stable and patient connected to nasal cannula oxygen Cardiovascular status: stable and blood pressure returned to baseline Postop Assessment: no apparent nausea or vomiting Anesthetic complications: no   There were no known notable events for this encounter.  Arita Miss

## 2022-09-25 NOTE — Anesthesia Preprocedure Evaluation (Signed)
Anesthesia Evaluation  Patient identified by MRN, date of birth, ID band Patient awake    Reviewed: Allergy & Precautions, H&P , NPO status , Patient's Chart, lab work & pertinent test results, reviewed documented beta blocker date and time   History of Anesthesia Complications Negative for: history of anesthetic complications  Airway Mallampati: II  TM Distance: >3 FB Neck ROM: full    Dental  (+) Caps, Dental Advidsory Given, Teeth Intact   Pulmonary neg pulmonary ROS, former smoker,    Pulmonary exam normal breath sounds clear to auscultation       Cardiovascular Exercise Tolerance: Good hypertension, (-) angina(-) Past MI and (-) Cardiac Stents Normal cardiovascular exam(-) dysrhythmias (-) Valvular Problems/Murmurs Rhythm:regular Rate:Normal     Neuro/Psych negative neurological ROS  negative psych ROS   GI/Hepatic Neg liver ROS, GERD  ,  Endo/Other  negative endocrine ROS  Renal/GU negative Renal ROS  negative genitourinary   Musculoskeletal   Abdominal   Peds  Hematology negative hematology ROS (+)   Anesthesia Other Findings Past Medical History: 04/1997: Breast cancer ([place])     Comment:  right breast cancer, chemo and radiation No date: GERD (gastroesophageal reflux disease) No date: Hyperlipidemia No date: Hypertension No date: Personal history of chemotherapy No date: Personal history of radiation therapy   Reproductive/Obstetrics negative OB ROS                             Anesthesia Physical  Anesthesia Plan  ASA: 2  Anesthesia Plan: MAC   Post-op Pain Management:    Induction: Intravenous  PONV Risk Score and Plan: 2 and Midazolam and Treatment may vary due to age or medical condition  Airway Management Planned: Natural Airway and Nasal Cannula  Additional Equipment:   Intra-op Plan:   Post-operative Plan:   Informed Consent: I have reviewed the  patients History and Physical, chart, labs and discussed the procedure including the risks, benefits and alternatives for the proposed anesthesia with the patient or authorized representative who has indicated his/her understanding and acceptance.     Dental Advisory Given  Plan Discussed with: Anesthesiologist, CRNA and Surgeon  Anesthesia Plan Comments: (Explained risks of anesthesia, including PONV, and rare emergencies such as cardiac events, respiratory problems, and allergic reactions, requiring invasive intervention. Discussed the role of CRNA in patient's perioperative care. Patient understands. )        Anesthesia Quick Evaluation

## 2022-09-26 ENCOUNTER — Encounter: Payer: Self-pay | Admitting: Ophthalmology

## 2022-12-05 ENCOUNTER — Encounter: Payer: Self-pay | Admitting: *Deleted

## 2022-12-08 ENCOUNTER — Encounter: Payer: Self-pay | Admitting: *Deleted

## 2022-12-08 ENCOUNTER — Ambulatory Visit: Payer: Medicare Other | Admitting: Anesthesiology

## 2022-12-08 ENCOUNTER — Ambulatory Visit
Admission: RE | Admit: 2022-12-08 | Discharge: 2022-12-08 | Disposition: A | Discharge status: Home / Self Care | Payer: Medicare Other | Source: Ambulatory Visit | Attending: Gastroenterology | Admitting: Gastroenterology

## 2022-12-08 ENCOUNTER — Encounter
Admission: RE | Disposition: A | Discharge status: Home / Self Care | Payer: Self-pay | Source: Ambulatory Visit | Attending: Gastroenterology

## 2022-12-08 DIAGNOSIS — Z923 Personal history of irradiation: Secondary | ICD-10-CM | POA: Insufficient documentation

## 2022-12-08 DIAGNOSIS — Z8543 Personal history of malignant neoplasm of ovary: Secondary | ICD-10-CM | POA: Diagnosis not present

## 2022-12-08 DIAGNOSIS — Z87891 Personal history of nicotine dependence: Secondary | ICD-10-CM | POA: Diagnosis not present

## 2022-12-08 DIAGNOSIS — E785 Hyperlipidemia, unspecified: Secondary | ICD-10-CM | POA: Diagnosis not present

## 2022-12-08 DIAGNOSIS — K640 First degree hemorrhoids: Secondary | ICD-10-CM | POA: Diagnosis not present

## 2022-12-08 DIAGNOSIS — Z9221 Personal history of antineoplastic chemotherapy: Secondary | ICD-10-CM | POA: Diagnosis not present

## 2022-12-08 DIAGNOSIS — Z853 Personal history of malignant neoplasm of breast: Secondary | ICD-10-CM | POA: Diagnosis not present

## 2022-12-08 DIAGNOSIS — K219 Gastro-esophageal reflux disease without esophagitis: Secondary | ICD-10-CM | POA: Diagnosis not present

## 2022-12-08 DIAGNOSIS — D125 Benign neoplasm of sigmoid colon: Secondary | ICD-10-CM | POA: Diagnosis not present

## 2022-12-08 DIAGNOSIS — Z1211 Encounter for screening for malignant neoplasm of colon: Secondary | ICD-10-CM | POA: Insufficient documentation

## 2022-12-08 DIAGNOSIS — Z9049 Acquired absence of other specified parts of digestive tract: Secondary | ICD-10-CM | POA: Diagnosis not present

## 2022-12-08 DIAGNOSIS — I10 Essential (primary) hypertension: Secondary | ICD-10-CM | POA: Insufficient documentation

## 2022-12-08 DIAGNOSIS — K5730 Diverticulosis of large intestine without perforation or abscess without bleeding: Secondary | ICD-10-CM | POA: Diagnosis not present

## 2022-12-08 HISTORY — PX: COLONOSCOPY WITH PROPOFOL: SHX5780

## 2022-12-08 SURGERY — COLONOSCOPY WITH PROPOFOL
Anesthesia: General

## 2022-12-08 MED ORDER — GLYCOPYRROLATE 0.2 MG/ML IJ SOLN
INTRAMUSCULAR | Status: DC | PRN
  Administered 2022-12-08: .1 mg via INTRAVENOUS

## 2022-12-08 MED ORDER — GLYCOPYRROLATE 0.2 MG/ML IJ SOLN
INTRAMUSCULAR | Status: AC
  Filled 2022-12-08: qty 1

## 2022-12-08 MED ORDER — PROPOFOL 10 MG/ML IV BOLUS
INTRAVENOUS | Status: DC | PRN
  Administered 2022-12-08: 50 mg via INTRAVENOUS

## 2022-12-08 MED ORDER — SODIUM CHLORIDE 0.9 % IV SOLN: INTRAVENOUS | Status: DC

## 2022-12-08 MED ORDER — PROPOFOL 10 MG/ML IV BOLUS
INTRAVENOUS | Status: AC
  Filled 2022-12-08: qty 40

## 2022-12-08 MED ORDER — PHENYLEPHRINE 80 MCG/ML (10ML) SYRINGE FOR IV PUSH (FOR BLOOD PRESSURE SUPPORT)
PREFILLED_SYRINGE | INTRAVENOUS | Status: DC | PRN
  Administered 2022-12-08: 80 ug via INTRAVENOUS

## 2022-12-08 MED ORDER — PROPOFOL 500 MG/50ML IV EMUL
INTRAVENOUS | Status: DC | PRN
  Administered 2022-12-08: 150 ug/kg/min via INTRAVENOUS

## 2022-12-08 MED ORDER — LIDOCAINE HCL (PF) 2 % IJ SOLN
INTRAMUSCULAR | Status: AC
  Filled 2022-12-08: qty 5

## 2022-12-08 MED ORDER — LIDOCAINE HCL (CARDIAC) PF 100 MG/5ML IV SOSY
PREFILLED_SYRINGE | INTRAVENOUS | Status: DC | PRN
  Administered 2022-12-08: 40 mg via INTRAVENOUS
  Administered 2022-12-08: 50 mg via INTRAVENOUS

## 2022-12-08 NOTE — H&P (Signed)
Outpatient short stay form Pre-procedure 12/08/2022  Lesly Rubenstein, MD  Primary Physician: Ulyess Blossom, PA  Reason for visit:  Screening  History of present illness:    71 y/o lady with history of hypertension, HLD, and ovarian cancer here for screening colonoscopy. Last colon[place] over [place]. No blood thinners. History of surgery for ovarian cancer and cholecystectomy. No family history of GI malignancies.   No current facility-administered medications for this encounter.  Medications Prior to Admission  Medication Sig Dispense Refill Last Dose   losartan-hydrochlorothiazide (HYZAAR) 100-12.5 MG tablet Hold until followup with outpatient provider due to intermittent low blood pressure. 30 tablet 24 12/07/2022   aspirin 81 MG EC tablet Take 1 tablet by mouth daily.   12/06/2022   atorvastatin (LIPITOR) 20 MG tablet Take by mouth.      cyclobenzaprine (FLEXERIL) 10 MG tablet Take 10 mg by mouth 2 (two) times daily as needed. (Patient not taking: Reported on 07/17/2022)      diclofenac (VOLTAREN) 75 MG EC tablet Take 75 mg by mouth 2 (two) times daily as needed.      docusate sodium (COLACE) 100 MG capsule Take 100 mg by mouth daily as needed. (Patient not taking: Reported on 07/17/2022)      gabapentin (NEURONTIN) 100 MG capsule Take 100 mg by mouth 2 (two) times daily. (Patient not taking: Reported on 09/08/2022)      letrozole (FEMARA) 2.5 MG tablet Take 2.5 mg by mouth daily.      omeprazole (PRILOSEC) 40 MG capsule Take 40 mg by mouth daily.      Vitamin D, Ergocalciferol, (DRISDOL) 1.25 MG (50000 UNIT) CAPS capsule Take 50,000 Units by mouth once a week.        Allergies  Allergen Reactions   Oxycodone Palpitations    Raid heart rate     Past Medical History:  Diagnosis Date   Breast cancer ([place]) 04/1997   right breast cancer, chemo and radiation   GERD (gastroesophageal reflux disease)    Hyperlipidemia    Hypertension    Personal  history of chemotherapy    Personal history of radiation therapy     Review of systems:  Otherwise negative.    Physical Exam  Gen: Alert, oriented. Appears stated age.  HEENT: PERRLA. Lungs: No respiratory distress CV: RRR Abd: soft, benign, no masses Ext: No edema    Planned procedures: Proceed with colonoscopy. The patient understands the nature of the planned procedure, indications, risks, alternatives and potential complications including but not limited to bleeding, infection, perforation, damage to internal organs and possible oversedation/side effects from anesthesia. The patient agrees and gives consent to proceed.  Please refer to procedure notes for findings, recommendations and patient disposition/instructions.     Lesly Rubenstein, MD [facility]

## 2022-12-08 NOTE — Transfer of Care (Signed)
Immediate Anesthesia Transfer of Care Note  Patient: Pamela Trevino  Procedure(s) Performed: COLONOSCOPY WITH PROPOFOL  Patient Location: Endoscopy Unit  Anesthesia Type:General  Level of Consciousness: sedated  Airway & Oxygen Therapy: Patient Spontanous Breathing and Patient connected to nasal cannula oxygen  Post-op Assessment: Report given to RN and Post -op Vital signs reviewed and stable  Post vital signs: stable  Last Vitals:  Vitals Value Taken Time  BP 128/64 12/08/22 1123  Temp    Pulse 69 12/08/22 1123  Resp 17 12/08/22 1123  SpO2 100 % 12/08/22 1123    Last Pain:  Vitals:   12/08/22 1123  TempSrc:   PainSc: 0-No pain         Complications: No notable events documented.

## 2022-12-08 NOTE — Interval H&P Note (Signed)
History and Physical Interval Note:  12/08/2022 10:32 AM  Pamela Trevino  has presented today for surgery, with the diagnosis of Colon cancer screening (Z12.11).  The various methods of treatment have been discussed with the patient and family. After consideration of risks, benefits and other options for treatment, the patient has consented to  Procedure(s) with comments: COLONOSCOPY WITH PROPOFOL (N/A) - SPANISH INTERPRETER as a surgical intervention.  The patient's history has been reviewed, patient examined, no change in status, stable for surgery.  I have reviewed the patient's chart and labs.  Questions were answered to the patient's satisfaction.     Lesly Rubenstein  Ok to proceed with colonoscopy

## 2022-12-08 NOTE — Anesthesia Postprocedure Evaluation (Signed)
Anesthesia Post Note  Patient: Pamela Trevino  Procedure(s) Performed: COLONOSCOPY WITH PROPOFOL  Patient location during evaluation: Endoscopy Anesthesia Type: General Level of consciousness: awake and alert Pain management: pain level controlled Vital Signs Assessment: post-procedure vital signs reviewed and stable Respiratory status: spontaneous breathing, nonlabored ventilation, respiratory function stable and patient connected to nasal cannula oxygen Cardiovascular status: blood pressure returned to baseline and stable Postop Assessment: no apparent nausea or vomiting Anesthetic complications: no   No notable events documented.   Last Vitals:  Vitals:   12/08/22 1113 12/08/22 1123  BP: (!) 141/79 128/64  Pulse: 70 69  Resp: 17 17  Temp:    SpO2: 100% 100%    Last Pain:  Vitals:   12/08/22 1123  TempSrc:   PainSc: 0-No pain                 Precious Haws Emma Schupp

## 2022-12-08 NOTE — Anesthesia Preprocedure Evaluation (Signed)
Anesthesia Evaluation  Patient identified by MRN, date of birth, ID band Patient awake    Reviewed: Allergy & Precautions, NPO status , Patient's Chart, lab work & pertinent test results  History of Anesthesia Complications Negative for: history of anesthetic complications  Airway Mallampati: III  TM Distance: <3 FB Neck ROM: full    Dental  (+) Chipped   Pulmonary neg pulmonary ROS, neg shortness of breath, former smoker   Pulmonary exam normal        Cardiovascular Exercise Tolerance: Good hypertension, (-) angina (-) Past MI Normal cardiovascular exam     Neuro/Psych negative neurological ROS  negative psych ROS   GI/Hepatic Neg liver ROS,GERD  Controlled,,  Endo/Other  negative endocrine ROS    Renal/GU negative Renal ROS  negative genitourinary   Musculoskeletal   Abdominal   Peds  Hematology negative hematology ROS (+)   Anesthesia Other Findings Past Medical History: 04/1997: Breast cancer ([place])     Comment:  right breast cancer, chemo and radiation No date: GERD (gastroesophageal reflux disease) No date: Hyperlipidemia No date: Hypertension No date: Personal history of chemotherapy No date: Personal history of radiation therapy  Past Surgical History: 1998: BREAST BIOPSY; Left     Comment:  negative 1998: BREAST BIOPSY; Right     Comment:  positive 1998: BREAST LUMPECTOMY; Right     Comment:  lumpectomy with chemo and rad tx 09/11/2022: CATARACT EXTRACTION W/PHACO; Left     Comment:  Procedure: CATARACT EXTRACTION PHACO AND INTRAOCULAR               LENS PLACEMENT ([place]) LEFT 16.31 01:26.0;  Surgeon:               Norvel Richards, MD;  Location: [place];  Service: Ophthalmology;  Laterality: Left; 09/25/2022: CATARACT EXTRACTION W/PHACO; Right     Comment:  Procedure: CATARACT EXTRACTION PHACO AND INTRAOCULAR               LENS PLACEMENT ([facility]) RIGHT  EYEHANCE TORIC  LENS 18.49               01:37.8;  Surgeon: Norvel Richards, MD;  Location:              [place];  Service: Ophthalmology;                Laterality: Right;  BMI    Body Mass Index: 33.28 kg/m      Reproductive/Obstetrics negative OB ROS                             Anesthesia Physical Anesthesia Plan  ASA: 3  Anesthesia Plan: General   Post-op Pain Management:    Induction: Intravenous  PONV Risk Score and Plan: Propofol infusion and TIVA  Airway Management Planned: Natural Airway and Nasal Cannula  Additional Equipment:   Intra-op Plan:   Post-operative Plan:   Informed Consent: I have reviewed the patients History and Physical, chart, labs and discussed the procedure including the risks, benefits and alternatives for the proposed anesthesia with the patient or authorized representative who has indicated his/her understanding and acceptance.     Dental Advisory Given  Plan Discussed with: Anesthesiologist, CRNA and Surgeon  Anesthesia Plan Comments: (Patient declines interpreter   Patient consented for risks of anesthesia including but not limited to:  - adverse  reactions to medications - risk of airway placement if required - damage to eyes, teeth, lips or other oral mucosa - nerve damage due to positioning  - sore throat or hoarseness - Damage to heart, brain, nerves, lungs, other parts of body or loss of life  Patient voiced understanding.)       Anesthesia Quick Evaluation

## 2022-12-08 NOTE — Op Note (Signed)
[facility] Gastroenterology Patient Name: Pamela Trevino Procedure Date: 12/08/2022 9:24 AM MRN: [id number] Account #: [id number] Date of Birth: 03/12/1951 Admit Type: Outpatient Age: 71 Room: [facility] ENDO ROOM 3 Gender: Female Note Status: Finalized Instrument Name: Park Meo [id number] Procedure:             Colonoscopy Indications:           Screening for colorectal malignant neoplasm Providers:             Andrey Farmer MD, MD Referring MD:          No Local Md, MD (Referring MD) Medicines:             Monitored Anesthesia Care Complications:         No immediate complications. Estimated blood loss:                         Minimal. Procedure:             Pre-Anesthesia Assessment:                        - Prior to the procedure, a History and Physical was                         performed, and patient medications and allergies were                         reviewed. The patient is competent. The risks and                         benefits of the procedure and the sedation options and                         risks were discussed with the patient. All questions                         were answered and informed consent was obtained.                         Patient identification and proposed procedure were                         verified by the physician, the nurse, the                         anesthesiologist, the anesthetist and the technician                         in the endoscopy suite. Mental Status Examination:                         alert and oriented. Airway Examination: normal                         oropharyngeal airway and neck mobility. Respiratory                         Examination: clear to auscultation. CV Examination:  normal. Prophylactic Antibiotics: The patient does not                         require prophylactic antibiotics. Prior                         Anticoagulants: The patient has taken no anticoagulant                          or antiplatelet agents. ASA Grade Assessment: III - A                         patient with severe systemic disease. After reviewing                         the risks and benefits, the patient was deemed in                         satisfactory condition to undergo the procedure. The                         anesthesia plan was to use monitored anesthesia care                         (MAC). Immediately prior to administration of                         medications, the patient was re-assessed for adequacy                         to receive sedatives. The heart rate, respiratory                         rate, oxygen saturations, blood pressure, adequacy of                         pulmonary ventilation, and response to care were                         monitored throughout the procedure. The physical                         status of the patient was re-assessed after the                         procedure.                        After obtaining informed consent, the colonoscope was                         passed under direct vision. Throughout the procedure,                         the patient's blood pressure, pulse, and oxygen                         saturations were monitored continuously. The  Colonoscope was introduced through the anus and                         advanced to the the cecum, identified by appendiceal                         orifice and ileocecal valve. The colonoscopy was                         performed without difficulty. The patient tolerated                         the procedure well. The quality of the bowel                         preparation was good. The ileocecal valve, appendiceal                         orifice, and rectum were photographed. Findings:      The perianal and digital rectal examinations were normal.      A 3 mm polyp was found in the sigmoid colon. The polyp was sessile. The       polyp was removed with a cold  snare. Resection and retrieval were       complete. Estimated blood loss was minimal.      Multiple small-mouthed diverticula were found in the sigmoid colon,       descending colon and transverse colon.      Internal hemorrhoids were found during retroflexion. The hemorrhoids       were Grade I (internal hemorrhoids that do not prolapse).      The exam was otherwise without abnormality on direct and retroflexion       views. Impression:            - One 3 mm polyp in the sigmoid colon, removed with a                         cold snare. Resected and retrieved.                        - Diverticulosis in the sigmoid colon, in the                         descending colon and in the transverse colon.                        - Internal hemorrhoids.                        - The examination was otherwise normal on direct and                         retroflexion views. Recommendation:        - Discharge patient to home.                        - Resume previous diet.                        - Continue present medications.                        -  Await pathology results.                        - Repeat colonoscopy is not recommended due to current                         age (66 years or older) for surveillance.                        - Return to referring physician as previously                         scheduled. Procedure Code(s):     --- Professional ---                        [phone], Colonoscopy, flexible; with removal of                         tumor(s), polyp(s), or other lesion(s) by snare                         technique Diagnosis Code(s):     --- Professional ---                        Z12.11, Encounter for screening for malignant neoplasm                         of colon                        D12.5, Benign neoplasm of sigmoid colon                        K64.0, First degree hemorrhoids                        K57.30, Diverticulosis of large intestine without                          perforation or abscess without bleeding CPT copyright 2022 [employer]. All rights reserved. The codes documented in this report are preliminary and upon coder review may  be revised to meet current compliance requirements. Andrey Farmer MD, MD 12/08/2022 11:05:32 AM Number of Addenda: 0 Note Initiated On: 12/08/2022 9:24 AM Scope Withdrawal Time: 0 hours 8 minutes 52 seconds  Total Procedure Duration: 0 hours 12 minutes 17 seconds  Estimated Blood Loss:  Estimated blood loss was minimal.      [facility]

## 2022-12-09 ENCOUNTER — Encounter: Payer: Self-pay | Admitting: Gastroenterology

## 2022-12-09 LAB — SURGICAL PATHOLOGY

## 2023-07-31 ENCOUNTER — Emergency Department: Payer: Medicare Other

## 2023-07-31 ENCOUNTER — Other Ambulatory Visit: Payer: Self-pay

## 2023-07-31 ENCOUNTER — Emergency Department
Admission: EM | Admit: 2023-07-31 | Discharge: 2023-07-31 | Disposition: A | Discharge status: Home / Self Care | Payer: Medicare Other | Attending: Emergency Medicine | Admitting: Emergency Medicine

## 2023-07-31 ENCOUNTER — Encounter: Payer: Self-pay | Admitting: Emergency Medicine

## 2023-07-31 DIAGNOSIS — M25552 Pain in left hip: Secondary | ICD-10-CM | POA: Insufficient documentation

## 2023-07-31 DIAGNOSIS — M25551 Pain in right hip: Secondary | ICD-10-CM | POA: Diagnosis not present

## 2023-07-31 DIAGNOSIS — I10 Essential (primary) hypertension: Secondary | ICD-10-CM | POA: Insufficient documentation

## 2023-07-31 DIAGNOSIS — M25562 Pain in left knee: Secondary | ICD-10-CM | POA: Insufficient documentation

## 2023-07-31 DIAGNOSIS — M25561 Pain in right knee: Secondary | ICD-10-CM | POA: Diagnosis present

## 2023-07-31 DIAGNOSIS — W010XXA Fall on same level from slipping, tripping and stumbling without subsequent striking against object, initial encounter: Secondary | ICD-10-CM | POA: Diagnosis not present

## 2023-07-31 DIAGNOSIS — M7918 Myalgia, other site: Secondary | ICD-10-CM

## 2023-07-31 DIAGNOSIS — Y92009 Unspecified place in unspecified non-institutional (private) residence as the place of occurrence of the external cause: Secondary | ICD-10-CM | POA: Diagnosis not present

## 2023-07-31 DIAGNOSIS — W19XXXA Unspecified fall, initial encounter: Secondary | ICD-10-CM

## 2023-07-31 MED ORDER — HYDROCODONE-ACETAMINOPHEN 5-325 MG PO TABS
1.0000 | ORAL_TABLET | Freq: Once | ORAL | Status: AC
  Administered 2023-07-31: 1 via ORAL
  Filled 2023-07-31: qty 1

## 2023-07-31 MED ORDER — TRAMADOL HCL 50 MG PO TABS: 50.0000 mg | ORAL_TABLET | Freq: Three times a day (TID) | ORAL | 0 refills | Status: AC

## 2023-07-31 MED ORDER — ONDANSETRON 4 MG PO TBDP
4.0000 mg | ORAL_TABLET | Freq: Once | ORAL | Status: AC
  Administered 2023-07-31: 4 mg via ORAL
  Filled 2023-07-31: qty 1

## 2023-07-31 MED ORDER — LIDOCAINE 5 % EX PTCH
1.0000 | MEDICATED_PATCH | CUTANEOUS | Status: DC
  Administered 2023-07-31: 1 via TRANSDERMAL
  Filled 2023-07-31: qty 1

## 2023-07-31 NOTE — ED Provider Notes (Signed)
[facility] Emergency Department Provider Note     Event Date/Time   First MD Initiated Contact with Patient 07/31/23 2013     (approximate)   History   Fall   HPI  Pamela Trevino is a 73 y.o. female history of HTN, GERD, HLD, presents to the ED following a mechanical fall.  Patient reports she slipped and fell while walking across her room room 4 at about 4 PM this afternoon.  She fell landed on her sofa, but endorses landed on both knees as well as having bilateral hip pain.  She denies any head injury or LOC.  Patient denies any blood thinner use.  She rates her current pain at a 10 out of 10.   Physical Exam   Triage Vital Signs: ED Triage Vitals  Encounter Vitals Group     BP 07/31/23 1912 (!) 149/82     Systolic BP Percentile --      Diastolic BP Percentile --      Pulse Rate 07/31/23 1912 76     Resp 07/31/23 1912 20     Temp 07/31/23 1912 98.2 F (36.8 C)     Temp Source 07/31/23 1912 Oral     SpO2 07/31/23 1912 94 %     Weight 07/31/23 1913 200 lb (90.7 kg)     Height 07/31/23 1913 5\' 5"  (1.651 m)     Head Circumference --      Peak Flow --      Pain Score 07/31/23 1913 10     Pain Loc --      Pain Education --      Exclude from Growth Chart --     Most recent vital signs: Vitals:   07/31/23 1912 07/31/23 2258  BP: (!) 149/82 135/81  Pulse: 76 71  Resp: 20 18  Temp: 98.2 F (36.8 C)   SpO2: 94% 96%    General Awake, no distress. NAD HEENT NCAT. PERRL. EOMI. No rhinorrhea. Mucous membranes are moist. CV:  Good peripheral perfusion. RRR RESP:  Normal effort. CTA ABD:  No distention. soft MSK:  No midline tenderness, spasm, vomiting, or step-off.  Full active range of motion of the lower extremities bilaterally.  No distal lower extremity dislocation or leg length discrepancy appreciated.   ED Results / Procedures / Treatments   Labs (all labs ordered are listed, but only abnormal results are displayed) Labs Reviewed -  No data to display   EKG   RADIOLOGY  I personally viewed and evaluated these images as part of my medical decision making, as well as reviewing the written report by the radiologist.  ED Provider Interpretation: No acute findings  DG Right / Left Knee  IMPRESSION: 1. Unremarkable bilateral knee arthroplasties.  No acute fracture.  DG Lumbar  Spine  IMPRESSION: 1. No acute fracture or listhesis of the lumbar spine. 2. Stable grade 1 anterolisthesis L4-5. 3. Diffuse degenerative disc disease, most severe at L4-S1. 4. Aortic atherosclerosis.  DG Bilateral Hips / Pelvis  IMPRESSION: 1. Unremarkable pelvis and bilateral hips.   PROCEDURES:  Critical Care performed: No  Procedures   MEDICATIONS ORDERED IN ED: Medications  HYDROcodone-acetaminophen (NORCO/VICODIN) 5-325 MG per tablet 1 tablet (1 tablet Oral Given 07/31/23 2133)  ondansetron (ZOFRAN-ODT) disintegrating tablet 4 mg (4 mg Oral Given 07/31/23 2133)     IMPRESSION / MDM / ASSESSMENT AND PLAN / ED COURSE  I reviewed the triage vital signs and the nursing notes.  Differential diagnosis includes, but is not limited to, lumbar fracture, lumbar sprain, lumbar disc telepathy, pelvic fracture, hip fracture, knee fracture, knee dislocation, DJD, myalgias  Patient's presentation is most consistent with acute complicated illness / injury requiring diagnostic workup.  Patient's diagnosis is consistent with fall at home and musculoskeletal pain. Patient will be discharged home with prescriptions for tramadol. Patient is to follow up with her provider as needed or otherwise directed. Patient is given ED precautions to return to the ED for any worsening or new symptoms.   FINAL CLINICAL IMPRESSION(S) / ED DIAGNOSES   Final diagnoses:  Fall in home, initial encounter  Musculoskeletal pain     Rx / DC Orders   ED Discharge Orders          Ordered    traMADol (ULTRAM) 50 MG tablet  3  times daily        07/31/23 2248             Note:  This document was prepared using Dragon voice recognition software and may include unintentional dictation errors.    Lissa Hoard, PA-C 08/05/23 [id number]    Sharyn Creamer, MD 08/06/23 [phone]

## 2023-07-31 NOTE — ED Notes (Signed)
Pt reports trip and fell in her living room, reports generalized body aches. Able to stand from wheelchair and transfer to bed, with minimal assistance Warm blanket provided

## 2023-07-31 NOTE — ED Notes (Signed)
Pt to Xray.

## 2023-07-31 NOTE — ED Triage Notes (Signed)
  Patient comes in with mechanical fall that occurred around 1600.  Patient states she was walking through her living room and got her feet tangled up causing her to fall onto her sofa.  Patient endorsing bilateral knee and hip pain.  No blood thinners.  Pain 10/10, aching.

## 2023-07-31 NOTE — Discharge Instructions (Addendum)
Take prescription pain medicine as needed for injuries related to your fall.  Your x-rays were negative for any acute fractures.

## 2023-08-12 ENCOUNTER — Other Ambulatory Visit: Payer: Self-pay | Admitting: Sports Medicine

## 2023-08-12 DIAGNOSIS — W19XXXA Unspecified fall, initial encounter: Secondary | ICD-10-CM

## 2023-08-12 DIAGNOSIS — M5441 Lumbago with sciatica, right side: Secondary | ICD-10-CM

## 2023-08-13 ENCOUNTER — Other Ambulatory Visit: Payer: Self-pay | Admitting: Family Medicine

## 2023-08-13 DIAGNOSIS — Z1231 Encounter for screening mammogram for malignant neoplasm of breast: Secondary | ICD-10-CM

## 2023-08-17 ENCOUNTER — Ambulatory Visit
Admission: RE | Admit: 2023-08-17 | Discharge: 2023-08-17 | Disposition: A | Discharge status: Home / Self Care | Payer: Medicare Other | Source: Ambulatory Visit | Attending: Sports Medicine | Admitting: Sports Medicine

## 2023-08-17 DIAGNOSIS — M5441 Lumbago with sciatica, right side: Secondary | ICD-10-CM

## 2023-08-17 DIAGNOSIS — W19XXXA Unspecified fall, initial encounter: Secondary | ICD-10-CM

## 2023-08-17 DIAGNOSIS — M4316 Spondylolisthesis, lumbar region: Secondary | ICD-10-CM | POA: Diagnosis not present

## 2023-08-17 DIAGNOSIS — M5442 Lumbago with sciatica, left side: Secondary | ICD-10-CM | POA: Diagnosis not present

## 2023-08-17 DIAGNOSIS — M48061 Spinal stenosis, lumbar region without neurogenic claudication: Secondary | ICD-10-CM | POA: Insufficient documentation

## 2023-08-25 ENCOUNTER — Ambulatory Visit
Admission: RE | Admit: 2023-08-25 | Discharge: 2023-08-25 | Disposition: A | Discharge status: Home / Self Care | Payer: Medicare Other | Source: Ambulatory Visit | Attending: Family Medicine | Admitting: Family Medicine

## 2023-08-25 DIAGNOSIS — Z1231 Encounter for screening mammogram for malignant neoplasm of breast: Secondary | ICD-10-CM | POA: Insufficient documentation

## 2024-06-14 NOTE — Progress Notes (Signed)
 Chief Complaint: Chief Complaint  Patient presents with  . Follow-up  Back pain traveling into the bilateral hips  HPI: Patient is a Pleasant 73 y.o. female seen in follow-up for the evaluation of back pain traveling into the bilateral hips.  She is status post bilateral L5-S1 TFESI 05/30/2024 with 50% improvement.  She denies any problems or complications postinjection.  She rates her pain as a 5/10.  Is intermittent, dull, aching.  She continues to take gabapentin 300 mg twice daily and denies side effects on it.  She does feel like it has been helping her.  She has discontinued physical therapy and is continue with a home exercise program.  Pain is chronic.  She denies any numbness, tingling, weakness or loss of control of bowel or bladder.  She does endorse intermittent cramping in the leg.  Pain is worse with standing and better with laying down.  Review of Systems: A 10 point review of systems is negative, except for the pertinent positives and negatives detailed in the HPI.  PMH: Past Medical History:  Diagnosis Date  . Arthritis   . Borderline hypertension 04/18/2013  . Breast cancer (CMS/HHS-HCC)   . Cholangitis (CMS-HCC) 2022  . PONV (postoperative nausea and vomiting)    nausea  . Refusal of blood transfusion for reasons of conscience 02/28/2021     PSH: Past Surgical History:  Procedure Laterality Date  . TUBAL LIGATION  06/19/1979  . BREAST SURGERY Right 1998   lumpectomy  . JOINT REPLACEMENT Bilateral 09/27/2011   knee replacements  . ARTHROPLASTY TOTAL KNEE Left 04/27/2013   Procedure: ARTHROPLASTY TOTAL KNEE;  Surgeon: Morton Jomarie Siva, MD;  Location: [facility] OR;  Service: Orthopedics;  Laterality: Left;  . ENDOSCOPY OF BILIARY DUCT  02/28/2021   Procedure: ENDOSCOPIC CATHETERIZATION OF THE BILIARY DUCTAL SYSTEM, RADIOLOGICAL SUPERVISION AND INTERPRETATION;  Surgeon: Alvan Gwendlyn Bars, MD;  Location: [facility];  Service: Gastroenterology;;  .  ENDOSCOPIC RETROGRADE CHOLANGIO-PANCREATOGRAPHY W/EXTRACTION STONE N/A 02/28/2021   Procedure: ENDOSCOPIC RETROGRADE CHOLANGIOPANCREATOGRAPHY (ERCP); WITH REMOVAL OF CALCULI/DEBRIS FROM BILIARY/PANCREATIC DUCT(S);  Surgeon: Alvan Gwendlyn Bars, MD;  Location: [facility];  Service: Gastroenterology;  Laterality: N/A;  . EXPLORATORY LAPAROTOMY N/A 04/05/2021   Procedure: EXPLORATORY LAPAROTOMY, EXPLORATORY CELIOTOMY WITH OR WITHOUT BIOPSY(S) (SEPARATE PROCEDURE);  Surgeon: Kristene Laymon Dragon, MD;  Location: [facility];  Service: Gynecology;  Laterality: N/A;  . RESECTION OVARIAN MALIGNANCY W/RADICAL DISSECTION N/A 04/05/2021   Procedure: BILATERAL SALPINGO-OOPHORECTOMY WITH OMENTECTOMY, TOTAL ABDOMINAL HYSTERECTOMY AND RADICAL DISSECTION FOR DEBULKING;  Surgeon: Kristene Laymon Dragon, MD;  Location: [facility];  Service: Gynecology;  Laterality: N/A;  . PELVIC EXAMINATION UNDER ANESTHESIA N/A 04/05/2021   Procedure: PELVIC EXAMINATION UNDER ANESTHESIA (OTHER THAN LOCAL);  Surgeon: Kristene Laymon Dragon, MD;  Location: [facility] OR;  Service: Gynecology;  Laterality: N/A;  . Colon @ [facility]  12/08/2022   Tubular adenoma/No repeat due to age/CTL  . CHOLECYSTECTOMY N/A   . MASTECTOMY PARTIAL / LUMPECTOMY Right      Family History: Family History  Problem Relation Name Age of Onset  . Arthritis Mother    . Myocardial Infarction (Heart attack) Mother    . Myocardial Infarction (Heart attack) Father    . Coronary Artery Disease (Blocked arteries around heart) Sister    . Stroke Brother    . Coronary Artery Disease (Blocked arteries around heart) Brother    . Breast cancer Neg Hx    . Colon cancer Neg Hx    . Endometrial cancer (Uterus  cancer) Neg Hx    . Ovarian cancer Neg Hx    . Anesthesia problems Neg Hx       Social History: Social History   Socioeconomic History  . Marital status: Married  Occupational History  . Occupation: Retired  Tobacco Use  .  Smoking status: Former    Current packs/day: 0.00    Types: Cigarettes    Start date: 04/19/1967    Quit date: 04/18/1977    Years since quitting: 47.1  . Smokeless tobacco: Never  Vaping Use  . Vaping status: Never Used  Substance and Sexual Activity  . Alcohol use: Not Currently  . Drug use: No  . Sexual activity: Not Currently    Partners: Male  Social History Narrative   Married; 2 grown children   From [place]; has lived in [place]  for 40+ yrs; bilingual   Denies tobacco/drug use    Occasional etoh   1 cup caffeine daily   Exercise: some   Social Drivers of Health   Transportation Needs: No Transportation Needs (04/08/2021)   PRAPARE - Transportation   . Lack of Transportation (Medical): No   . Lack of Transportation (Non-Medical): No     Allergies: Allergies  Allergen Reactions  . Oxycodone Palpitations    Rapid heart rate     Medications:  Current Outpatient Medications:  .  aspirin  81 MG EC tablet, TAKE 1 TABLET BY MOUTH ONCE DAILY, Disp: 90 tablet, Rfl: 2 .  atorvastatin  (LIPITOR) 20 MG tablet, Take 1 tablet (20 mg total) by mouth once daily, Disp: 100 tablet, Rfl: 3 .  diclofenac (VOLTAREN) 75 MG EC tablet, Take 1 tablet (75 mg total) by mouth 2 (two) times daily with meals, Disp: 180 tablet, Rfl: 3 .  docusate sodium (STOOL SOFTENER ORAL), Take by mouth as needed, Disp: , Rfl:  .  ergocalciferol, vitamin D2, 1,250 mcg (50,000 unit) capsule, 50,000 units two times per month (first and fifteenth of every month), Disp: 6 capsule, Rfl: 4 .  famotidine (PEPCID ORAL), Take by mouth as needed, Disp: , Rfl:  .  gabapentin (NEURONTIN) 300 MG capsule, Take 1 capsule (300 mg total) by mouth 2 (two) times daily, Disp: 180 capsule, Rfl: 1 .  letrozole (FEMARA) 2.5 mg tablet, TAKE 1 TABLET ONCE DAILY, Disp: 90 tablet, Rfl: 3 .  losartan -hydroCHLOROthiazide (HYZAAR) 100-12.5 mg tablet, Take 1 tablet by mouth once daily, Disp: 100 tablet, Rfl: 3 .  omeprazole (PRILOSEC) 40 MG DR  capsule, TAKE 1 CAPSULE ONCE DAILY, Disp: 100 capsule, Rfl: 0 .  tiZANidine (ZANAFLEX) 2 MG capsule, Take 1-2 capsules (2-4 mg total) by mouth 2 (two) times daily as needed for Muscle spasms, Disp: 60 capsule, Rfl: 0  Vitals: Vitals:   06/14/24 1403  BP: 133/76  Pulse: 86  Temp: 36.8 C (98.3 F)  TempSrc: Oral  Weight: 94.3 kg (207 lb 14.3 oz)  Height: 162.6 cm (5' 4.02)  PainSc:   5  PainLoc: Back   Imaging: [facility] lumbar spine [facility] 08/2023: T12 and L1 mild to moderate bilateral NFS; L3-4 mild disc bulging; L4-5 facet arthropathy with anteriolisthesis and disc bulge causing severe CCS; L5-S1 facet arthropathy and DDD  Platelet count 272 drawn on 05/06/2024 GFR 44 drawn on 05/06/2024  Assessment: 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  I do recommend that she continue with a home exercise program.  I She denies any problems or complications postinjection.  At this point she does feel like her pain level is under control so recommend holding off any further injections.     She has been taking gabapentin 300 mg twice daily.  She states that she does need a refill.  She denies any side effects on the medication.  Prescription for gabapentin sent to her mail order pharmacy.     I did review the hemoglobin A1c, CBC and CMP from 05/06/2024.  Please see pertinent values above.  I did discuss potential for surgical referral but she feels that  is premature at this time for consideration of surgery.  Follow-up as needed.  She may call back for bilateral L5-S1 TFESI with Celestone no sooner than 08/30/2024.       Patient agrees with above plan.  Answered all questions.

## 2024-08-09 ENCOUNTER — Other Ambulatory Visit: Payer: Self-pay | Admitting: Family Medicine

## 2024-08-09 DIAGNOSIS — Z1231 Encounter for screening mammogram for malignant neoplasm of breast: Secondary | ICD-10-CM

## 2024-08-25 ENCOUNTER — Ambulatory Visit
Admission: RE | Admit: 2024-08-25 | Discharge: 2024-08-25 | Disposition: A | Discharge status: Home / Self Care | Source: Ambulatory Visit | Attending: Family Medicine | Admitting: Family Medicine

## 2024-08-25 DIAGNOSIS — Z1231 Encounter for screening mammogram for malignant neoplasm of breast: Secondary | ICD-10-CM | POA: Diagnosis present
